# Patient Record
Sex: Female | Born: 1965
Health system: Southern US, Community
[De-identification: ages and names within clinical notes are randomized; demographics above are authoritative.]

## PROBLEM LIST (undated history)

## (undated) DIAGNOSIS — K222 Esophageal obstruction: Secondary | ICD-10-CM

## (undated) DIAGNOSIS — K8020 Calculus of gallbladder without cholecystitis without obstruction: Secondary | ICD-10-CM

## (undated) DIAGNOSIS — J302 Other seasonal allergic rhinitis: Secondary | ICD-10-CM

## (undated) DIAGNOSIS — E669 Obesity, unspecified: Secondary | ICD-10-CM

## (undated) DIAGNOSIS — L719 Rosacea, unspecified: Secondary | ICD-10-CM

## (undated) DIAGNOSIS — F329 Major depressive disorder, single episode, unspecified: Secondary | ICD-10-CM

## (undated) DIAGNOSIS — C44310 Basal cell carcinoma of skin of unspecified parts of face: Secondary | ICD-10-CM

## (undated) DIAGNOSIS — F3289 Other specified depressive episodes: Secondary | ICD-10-CM

## (undated) DIAGNOSIS — K219 Gastro-esophageal reflux disease without esophagitis: Secondary | ICD-10-CM

## (undated) DIAGNOSIS — E119 Type 2 diabetes mellitus without complications: Secondary | ICD-10-CM

## (undated) DIAGNOSIS — D259 Leiomyoma of uterus, unspecified: Secondary | ICD-10-CM

## (undated) DIAGNOSIS — E785 Hyperlipidemia, unspecified: Secondary | ICD-10-CM

## (undated) DIAGNOSIS — T7840XA Allergy, unspecified, initial encounter: Secondary | ICD-10-CM

## (undated) HISTORY — PX: UPPER GASTROINTESTINAL ENDOSCOPY: SHX188

## (undated) HISTORY — PX: COLON RESECTION: SHX5231

## (undated) HISTORY — DX: Basal cell carcinoma of skin of unspecified parts of face: C44.310

## (undated) HISTORY — DX: Rosacea, unspecified: L71.9

## (undated) HISTORY — PX: REDUCTION MAMMAPLASTY: SUR839

## (undated) HISTORY — DX: Other specified depressive episodes: F32.89

## (undated) HISTORY — PX: POLYPECTOMY: SHX149

## (undated) HISTORY — PX: WISDOM TOOTH EXTRACTION: SHX21

## (undated) HISTORY — DX: Major depressive disorder, single episode, unspecified: F32.9

## (undated) HISTORY — DX: Hyperlipidemia, unspecified: E78.5

## (undated) HISTORY — DX: Obesity, unspecified: E66.9

## (undated) HISTORY — DX: Leiomyoma of uterus, unspecified: D25.9

## (undated) HISTORY — DX: Other seasonal allergic rhinitis: J30.2

## (undated) HISTORY — DX: Allergy, unspecified, initial encounter: T78.40XA

## (undated) HISTORY — DX: Gastro-esophageal reflux disease without esophagitis: K21.9

## (undated) HISTORY — PX: COLONOSCOPY: SHX174

## (undated) HISTORY — PX: EXTERNAL EAR SURGERY: SHX627

## (undated) HISTORY — DX: Calculus of gallbladder without cholecystitis without obstruction: K80.20

## (undated) HISTORY — DX: Type 2 diabetes mellitus without complications: E11.9

## (undated) HISTORY — DX: Esophageal obstruction: K22.2

---
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
## 1999-10-07 ENCOUNTER — Other Ambulatory Visit: Admission: RE | Admit: 1999-10-07 | Discharge: 1999-10-07 | Payer: Self-pay | Admitting: Obstetrics and Gynecology

## 2000-05-11 ENCOUNTER — Encounter: Payer: Self-pay | Admitting: Internal Medicine

## 2000-05-11 ENCOUNTER — Ambulatory Visit (HOSPITAL_COMMUNITY): Admission: RE | Admit: 2000-05-11 | Discharge: 2000-05-11 | Payer: Self-pay | Admitting: Internal Medicine

## 2000-06-22 ENCOUNTER — Ambulatory Visit (HOSPITAL_COMMUNITY): Admission: RE | Admit: 2000-06-22 | Discharge: 2000-06-22 | Payer: Self-pay | Admitting: Internal Medicine

## 2000-06-22 ENCOUNTER — Encounter: Payer: Self-pay | Admitting: Internal Medicine

## 2000-10-11 ENCOUNTER — Other Ambulatory Visit: Admission: RE | Admit: 2000-10-11 | Discharge: 2000-10-11 | Payer: Self-pay | Admitting: Obstetrics and Gynecology

## 2001-09-14 ENCOUNTER — Other Ambulatory Visit: Admission: RE | Admit: 2001-09-14 | Discharge: 2001-09-14 | Payer: Self-pay | Admitting: Obstetrics and Gynecology

## 2002-11-15 ENCOUNTER — Other Ambulatory Visit: Admission: RE | Admit: 2002-11-15 | Discharge: 2002-11-15 | Payer: Self-pay | Admitting: Obstetrics and Gynecology

## 2003-08-22 ENCOUNTER — Encounter: Admission: RE | Admit: 2003-08-22 | Discharge: 2003-11-20 | Payer: Self-pay | Admitting: Family Medicine

## 2005-07-21 ENCOUNTER — Ambulatory Visit: Payer: Self-pay | Admitting: Internal Medicine

## 2005-08-14 DIAGNOSIS — K222 Esophageal obstruction: Secondary | ICD-10-CM

## 2005-08-14 HISTORY — DX: Esophageal obstruction: K22.2

## 2005-08-19 ENCOUNTER — Ambulatory Visit: Payer: Self-pay | Admitting: Internal Medicine

## 2005-08-19 ENCOUNTER — Encounter: Payer: Self-pay | Admitting: Internal Medicine

## 2005-11-10 ENCOUNTER — Ambulatory Visit: Payer: Self-pay | Admitting: Internal Medicine

## 2007-02-24 ENCOUNTER — Ambulatory Visit: Payer: Self-pay | Admitting: Internal Medicine

## 2007-03-14 DIAGNOSIS — K222 Esophageal obstruction: Secondary | ICD-10-CM | POA: Insufficient documentation

## 2007-03-14 DIAGNOSIS — K219 Gastro-esophageal reflux disease without esophagitis: Secondary | ICD-10-CM

## 2007-03-14 DIAGNOSIS — R1310 Dysphagia, unspecified: Secondary | ICD-10-CM | POA: Insufficient documentation

## 2007-03-23 ENCOUNTER — Ambulatory Visit (HOSPITAL_COMMUNITY): Admission: RE | Admit: 2007-03-23 | Discharge: 2007-03-23 | Payer: Self-pay | Admitting: Internal Medicine

## 2008-04-10 ENCOUNTER — Telehealth: Payer: Self-pay | Admitting: Internal Medicine

## 2008-05-04 ENCOUNTER — Ambulatory Visit: Payer: Self-pay | Admitting: Internal Medicine

## 2008-05-04 DIAGNOSIS — K8020 Calculus of gallbladder without cholecystitis without obstruction: Secondary | ICD-10-CM | POA: Insufficient documentation

## 2008-08-21 ENCOUNTER — Ambulatory Visit: Payer: Self-pay | Admitting: Internal Medicine

## 2008-08-21 DIAGNOSIS — F32A Depression, unspecified: Secondary | ICD-10-CM | POA: Insufficient documentation

## 2008-08-21 DIAGNOSIS — L719 Rosacea, unspecified: Secondary | ICD-10-CM | POA: Insufficient documentation

## 2008-08-21 DIAGNOSIS — K279 Peptic ulcer, site unspecified, unspecified as acute or chronic, without hemorrhage or perforation: Secondary | ICD-10-CM | POA: Insufficient documentation

## 2008-08-21 DIAGNOSIS — F329 Major depressive disorder, single episode, unspecified: Secondary | ICD-10-CM

## 2008-08-21 DIAGNOSIS — E669 Obesity, unspecified: Secondary | ICD-10-CM | POA: Insufficient documentation

## 2008-08-21 LAB — CONVERTED CEMR LAB: Pap Smear: NORMAL

## 2008-09-26 ENCOUNTER — Encounter (INDEPENDENT_AMBULATORY_CARE_PROVIDER_SITE_OTHER): Payer: Self-pay | Admitting: *Deleted

## 2008-09-26 ENCOUNTER — Ambulatory Visit: Payer: Self-pay | Admitting: Internal Medicine

## 2008-09-26 LAB — CONVERTED CEMR LAB
ALT: 49 units/L — ABNORMAL HIGH (ref 0–35)
AST: 30 units/L (ref 0–37)
Albumin: 3.5 g/dL (ref 3.5–5.2)
Alkaline Phosphatase: 63 units/L (ref 39–117)
BUN: 11 mg/dL (ref 6–23)
Basophils Absolute: 0 10*3/uL (ref 0.0–0.1)
Basophils Relative: 0.6 % (ref 0.0–3.0)
Bilirubin Urine: NEGATIVE
Bilirubin, Direct: 0.1 mg/dL (ref 0.0–0.3)
CO2: 27 meq/L (ref 19–32)
Calcium: 8.9 mg/dL (ref 8.4–10.5)
Chloride: 108 meq/L (ref 96–112)
Cholesterol: 197 mg/dL (ref 0–200)
Creatinine, Ser: 0.8 mg/dL (ref 0.4–1.2)
Eosinophils Absolute: 0.2 10*3/uL (ref 0.0–0.7)
Eosinophils Relative: 3.4 % (ref 0.0–5.0)
GFR calc non Af Amer: 82.88 mL/min (ref >60)
Glucose, Bld: 92 mg/dL (ref 70–99)
HCT: 40.5 % (ref 36.0–46.0)
HDL: 37.5 mg/dL — ABNORMAL LOW (ref >39.00)
Hemoglobin, Urine: NEGATIVE
Hemoglobin: 14 g/dL (ref 12.0–15.0)
Ketones, ur: NEGATIVE mg/dL
LDL Cholesterol: 147 mg/dL — ABNORMAL HIGH (ref 0–99)
Leukocytes, UA: NEGATIVE
Lymphocytes Relative: 38.9 % (ref 12.0–46.0)
Lymphs Abs: 2.8 10*3/uL (ref 0.7–4.0)
MCHC: 34.5 g/dL (ref 30.0–36.0)
MCV: 91.2 fL (ref 78.0–100.0)
Monocytes Absolute: 0.4 10*3/uL (ref 0.1–1.0)
Monocytes Relative: 5.9 % (ref 3.0–12.0)
Neutro Abs: 3.8 10*3/uL (ref 1.4–7.7)
Neutrophils Relative %: 51.2 % (ref 43.0–77.0)
Nitrite: NEGATIVE
Platelets: 287 10*3/uL (ref 150.0–400.0)
Potassium: 4.3 meq/L (ref 3.5–5.1)
RBC: 4.44 M/uL (ref 3.87–5.11)
RDW: 12.3 % (ref 11.5–14.6)
Sodium: 140 meq/L (ref 135–145)
Specific Gravity, Urine: >=1.03 (ref 1.000–1.030)
TSH: 1.84 microintl units/mL (ref 0.35–5.50)
Total Bilirubin: 0.7 mg/dL (ref 0.3–1.2)
Total CHOL/HDL Ratio: 5
Total Protein, Urine: NEGATIVE mg/dL
Total Protein: 6.6 g/dL (ref 6.0–8.3)
Triglycerides: 63 mg/dL (ref 0.0–149.0)
Urine Glucose: NEGATIVE mg/dL
Urobilinogen, UA: 0.2 (ref 0.0–1.0)
VLDL: 12.6 mg/dL (ref 0.0–40.0)
Vit D, 25-Hydroxy: 41 ng/mL (ref 30–89)
WBC: 7.2 10*3/uL (ref 4.5–10.5)
pH: 5.5 (ref 5.0–8.0)

## 2008-09-27 ENCOUNTER — Encounter: Payer: Self-pay | Admitting: Internal Medicine

## 2008-09-27 ENCOUNTER — Encounter: Admission: RE | Admit: 2008-09-27 | Discharge: 2008-09-27 | Payer: Self-pay | Admitting: Internal Medicine

## 2008-10-03 ENCOUNTER — Ambulatory Visit: Payer: Self-pay | Admitting: Internal Medicine

## 2008-10-04 ENCOUNTER — Encounter (INDEPENDENT_AMBULATORY_CARE_PROVIDER_SITE_OTHER): Payer: Self-pay | Admitting: *Deleted

## 2008-11-05 ENCOUNTER — Encounter: Payer: Self-pay | Admitting: Internal Medicine

## 2009-02-05 ENCOUNTER — Telehealth: Payer: Self-pay | Admitting: Internal Medicine

## 2009-05-20 ENCOUNTER — Ambulatory Visit: Payer: Self-pay | Admitting: Internal Medicine

## 2009-05-20 DIAGNOSIS — J329 Chronic sinusitis, unspecified: Secondary | ICD-10-CM | POA: Insufficient documentation

## 2009-12-31 ENCOUNTER — Ambulatory Visit: Payer: Self-pay | Admitting: Internal Medicine

## 2010-03-18 ENCOUNTER — Ambulatory Visit
Admission: RE | Admit: 2010-03-18 | Discharge: 2010-03-18 | Payer: Self-pay | Source: Home / Self Care | Attending: Internal Medicine | Admitting: Internal Medicine

## 2010-04-06 ENCOUNTER — Encounter: Payer: Self-pay | Admitting: Oncology

## 2010-04-15 NOTE — Assessment & Plan Note (Signed)
Summary: SORE THROAT  HEAD CONGESTION  STC   Vital Signs:  Patient profile:   45 year old female Height:      66 inches (167.64 cm) Weight:      196.0 pounds (89.09 kg) O2 Sat:      97 % on Room air Temp:     97.9 degrees F (36.61 degrees C) oral Pulse rate:   85 / minute BP sitting:   110 / 72  (left arm) Cuff size:   large  Vitals Entered By: Orlan Leavens RMA (December 31, 2009 2:09 PM)  O2 Flow:  Room air CC: head congestion/ sore throat Is Patient Diabetic? No Pain Assessment Patient in pain? no      Comments Req refill on nexium   Primary Care Provider:  Newt Lukes MD  CC:  head congestion/ sore throat.  History of Present Illness:  URI Symptoms      This is a 45 year old woman who presents with URI symptoms.  The symptoms began 6 days ago.  The severity is described as moderate.  The patient reports nasal congestion, clear but thick nasal discharge, sore throat, and sick contacts, and left earache.  Associated symptoms include low-grade fever (<100.5 degrees) and response to antipyretic.  The patient denies stiff neck, dyspnea, wheezing, vomiting, and diarrhea.  The patient also reports sneezing, headache, and severe fatigue.  The patient denies itchy watery eyes, itchy throat, seasonal symptoms, and muscle aches.  The patient denies the following risk factors for Strep sinusitis: tooth pain, Strep exposure, and tender adenopathy.    Clinical Review Panels:  Immunizations   Last Tetanus Booster:  Tdap (08/21/2008)  Lipid Management   Cholesterol:  197 (09/26/2008)   LDL (bad choesterol):  147 (09/26/2008)   HDL (good cholesterol):  37.50 (09/26/2008)  CBC   WBC:  7.2 (09/26/2008)   RBC:  4.44 (09/26/2008)   Hgb:  14.0 (09/26/2008)   Hct:  40.5 (09/26/2008)   Platelets:  287.0 (09/26/2008)   MCV  91.2 (09/26/2008)   MCHC  34.5 (09/26/2008)   RDW  12.3 (09/26/2008)   PMN:  51.2 (09/26/2008)   Lymphs:  38.9 (09/26/2008)   Monos:  5.9 (09/26/2008)  Eosinophils:  3.4 (09/26/2008)   Basophil:  0.6 (09/26/2008)  Complete Metabolic Panel   Glucose:  92 (09/26/2008)   Sodium:  140 (09/26/2008)   Potassium:  4.3 (09/26/2008)   Chloride:  108 (09/26/2008)   CO2:  27 (09/26/2008)   BUN:  11 (09/26/2008)   Creatinine:  0.8 (09/26/2008)   Albumin:  3.5 (09/26/2008)   Total Protein:  6.6 (09/26/2008)   Calcium:  8.9 (09/26/2008)   Total Bili:  0.7 (09/26/2008)   Alk Phos:  63 (09/26/2008)   SGPT (ALT):  49 (09/26/2008)   SGOT (AST):  30 (09/26/2008)   Current Medications (verified): 1)  Nexium 40 Mg Cpdr (Esomeprazole Magnesium) .... Take 1 Tablet By Mouth Once A Day. 2)  Retin-A 0.1 % Crea (Tretinoin) .... Apply At Bedtime As Directed 3)  Vitamin D 400 Unit Tabs (Cholecalciferol) .... Take 2 By Mouth Qd 4)  Pre-Natal  Tabs (Prenatal Multivit-Min-Fe-Fa) .... Take 1 By Mouth Qd 5)  Zyrtec Hives Relief 10 Mg Tabs (Cetirizine Hcl) .... Take 1 By Mouth Qd 6)  Doxycycline Hyclate 100 Mg Caps (Doxycycline Hyclate) .... Take 2 By Mouth Once Daily 7)  Sudafed 30 Mg Tabs (Pseudoephedrine Hcl) .... Take As Needed  Allergies (verified): No Known Drug Allergies  Past History:  Past Medical History: GALLSTONES ESOPHAGEAL STRICTURE  roseca Depression GERD  MD roster: gyn - [facility], ?silva  Review of Systems  The patient denies vision loss, hoarseness, hemoptysis, and abdominal pain.    Physical Exam  General:  overweight-appearing.  alert, well-developed, well-nourished, and cooperative to examination.  mildly ill   Eyes:  vision grossly intact; pupils equal, round and reactive to light.  conjunctiva and lids normal.    Ears:  normal pinnae bilaterally, without erythema, swelling, or tenderness to palpation. TMs hazy but clear, without effusion, or cerumen impaction. Hearing grossly normal bilaterally  Mouth:  teeth and gums in good repair; mucous membranes moist, without lesions or ulcers. oropharynx clear without exudate, mild  erythema. +yellow PND Lungs:  normal respiratory effort, no intercostal retractions or use of accessory muscles; normal breath sounds bilaterally - no crackles and no wheezes.    Heart:  normal rate, regular rhythm, no murmur, and no rub. BLE without edema.    Impression & Recommendations:  Problem # 1:  UNSPECIFIED SINUSITIS (ICD-473.9)  The following medications were removed from the medication list:    Mucinex Dm 30-600 Mg Xr12h-tab (Dextromethorphan-guaifenesin) .Marland Kitchen... Taker as needed Her updated medication list for this problem includes:    Doxycycline Hyclate 100 Mg Caps (Doxycycline hyclate) .Marland Kitchen... Take 2 by mouth once daily    Sudafed 30 Mg Tabs (Pseudoephedrine hcl) .Marland Kitchen... Take as needed    Azithromycin 250 Mg Tabs (Azithromycin) .Marland Kitchen... 2 tabs by mouth today, then 1 by mouth daily starting tomorrow  Take antibiotics for full duration. Discussed treatment options including indications for coronal CT scan of sinuses and ENT referral.   Orders: Prescription Created Electronically [phone])  Complete Medication List: 1)  Nexium 40 Mg Cpdr (Esomeprazole magnesium) .Marland Kitchen.. 1 by mouth once daily 2)  Retin-a 0.1 % Crea (Tretinoin) .... Apply at bedtime as directed 3)  Vitamin D 400 Unit Tabs (Cholecalciferol) .... Take 2 by mouth qd 4)  Pre-natal Tabs (Prenatal multivit-min-fe-fa) .... Take 1 by mouth qd 5)  Zyrtec Hives Relief 10 Mg Tabs (Cetirizine hcl) .... Take 1 by mouth qd 6)  Doxycycline Hyclate 100 Mg Caps (Doxycycline hyclate) .... Take 2 by mouth once daily 7)  Sudafed 30 Mg Tabs (Pseudoephedrine hcl) .... Take as needed 8)  Azithromycin 250 Mg Tabs (Azithromycin) .... 2 tabs by mouth today, then 1 by mouth daily starting tomorrow  Patient Instructions: 1)  it was good to see you today. 2)  Zpack antibiotics for sinus symptoms as discussed - your prescription has been electronically submitted to your pharmacy. Please take as directed. Contact our office if you believe you're having  problems with the medication(s).  3)  Get plenty of rest, drink lots of clear liquids, and use Tylenol or Ibuprofen for fever and comfort. Return in 7-10 days if you're not better:sooner if you're feeling worse. 4)  ask [facility] about seeing either dr. Truett Mainland or dr. Henderson Cloud instead of dr. Tenny Craw - if unable to see different provider, let us know and we can refer to different group for evaluation as needed  Prescriptions: NEXIUM 40 MG CPDR (ESOMEPRAZOLE MAGNESIUM) 1 by mouth once daily  #30 x 11   Entered and Authorized by:   Newt Lukes MD   Signed by:   Newt Lukes MD on 12/31/2009   Method used:   Electronically to        [facility] [address] (retail)       [phone]  [place], [place]  [address]       Ph: [phone]       Fax: [phone]   RxID:   [id number] AZITHROMYCIN 250 MG TABS (AZITHROMYCIN) 2 tabs by mouth today, then 1 by mouth daily starting tomorrow  #6 x 0   Entered and Authorized by:   Newt Lukes MD   Signed by:   Newt Lukes MD on 12/31/2009   Method used:   Electronically to        [facility] (retail)       [address]       [place], [place]  [address]       Ph: [phone]       Fax: [phone]   RxID:   [id number]    Orders Added: 1)  Est. Patient Level IV [85277] 2)  Prescription Created Electronically [phone]

## 2010-04-15 NOTE — Assessment & Plan Note (Signed)
Summary: BAD COLD /NWS  #   Vital Signs:  Patient profile:   45 year old female Height:      66 inches (167.64 cm) Weight:      197.6 pounds (89.82 kg) BMI:     32.01 O2 Sat:      99 % on Room air Temp:     97.6 degrees F (36.44 degrees C) oral Pulse rate:   74 / minute BP sitting:   118 / 92  (left arm) Cuff size:   regular  Vitals Entered By: Orlan Leavens (May 20, 2009 1:44 PM)  O2 Flow:  Room air CC: Cold sxs x's 4 days, URI symptoms Is Patient Diabetic? No Pain Assessment Patient in pain? no        Primary Care Provider:  Newt Lukes MD  CC:  Cold sxs x's 4 days and URI symptoms.  History of Present Illness:  URI Symptoms      This is a 45 year old woman who presents with URI symptoms.  The symptoms began 4 days ago.  The severity is described as moderate.  The patient reports nasal congestion, purulent nasal discharge, sore throat, and sick contacts, but denies earache.  Associated symptoms include low-grade fever (<100.5 degrees) and response to antipyretic.  The patient denies stiff neck, dyspnea, wheezing, vomiting, and diarrhea.  The patient also reports sneezing, headache, and severe fatigue.  The patient denies itchy watery eyes, itchy throat, seasonal symptoms, and muscle aches.  The patient denies the following risk factors for Strep sinusitis: tooth pain, Strep exposure, and tender adenopathy.    Current Medications (verified): 1)  Nexium 40 Mg Cpdr (Esomeprazole Magnesium) .... Take 1 Tablet By Mouth Once A Day. 2)  Oracea 40 Mg Cpdr (Doxycycline (Rosacea)) .... One Capsule By Mouth Once Daily 3)  Retin-A 0.1 % Crea (Tretinoin) .... Apply At Bedtime As Directed 4)  Vitamin D 400 Unit Tabs (Cholecalciferol) .... Take 2 By Mouth Qd 5)  Pre-Natal  Tabs (Prenatal Multivit-Min-Fe-Fa) .... Take 1 By Mouth Qd 6)  Zyrtec Hives Relief 10 Mg Tabs (Cetirizine Hcl) .... Take 1 By Mouth Qd 7)  Mucinex Dm 30-600 Mg Xr12h-Tab (Dextromethorphan-Guaifenesin) .... Taker  As Needed  Allergies (verified): No Known Drug Allergies  Past History:  Past Medical History: Reviewed history from 08/21/2008 and no changes required. GALLSTONES ESOPHAGEAL STRICTURE  roseca Depression GERD  Review of Systems  The patient denies anorexia, vision loss, decreased hearing, hoarseness, chest pain, peripheral edema, hemoptysis, and abdominal pain.    Physical Exam  General:  overweight-appearing.  alert, well-developed, well-nourished, and cooperative to examination.  mildly ill   Eyes:  vision grossly intact; pupils equal, round and reactive to light.  conjunctiva and lids normal.    Ears:  normal pinnae bilaterally, without erythema, swelling, or tenderness to palpation. TMs hazy but clear, without effusion, or cerumen impaction. Hearing grossly normal bilaterally  Nose:  mild L and R maxillary sinus tenderness.   Mouth:  teeth and gums in good repair; mucous membranes moist, without lesions or ulcers. oropharynx clear without exudate, mild erythema. +yellow PND Lungs:  normal respiratory effort, no intercostal retractions or use of accessory muscles; normal breath sounds bilaterally - no crackles and no wheezes.    Heart:  normal rate, regular rhythm, no murmur, and no rub. BLE without edema.    Impression & Recommendations:  Problem # 1:  UNSPECIFIED SINUSITIS (ICD-473.9)  Her updated medication list for this problem includes:  Mucinex Dm 30-600 Mg Xr12h-tab (Dextromethorphan-guaifenesin) .Marland Kitchen... Taker as needed    Azithromycin 250 Mg Tabs (Azithromycin) .Marland Kitchen... 2 tabs by mouth today, then 1 by mouth daily starting tomorrow  Take antibiotics for full duration. Discussed treatment options including prescription and OTC meds  Complete Medication List: 1)  Nexium 40 Mg Cpdr (Esomeprazole magnesium) .... Take 1 tablet by mouth once a day. 2)  Oracea 40 Mg Cpdr (Doxycycline (rosacea)) .... One capsule by mouth once daily 3)  Retin-a 0.1 % Crea (Tretinoin) ....  Apply at bedtime as directed 4)  Vitamin D 400 Unit Tabs (Cholecalciferol) .... Take 2 by mouth qd 5)  Pre-natal Tabs (Prenatal multivit-min-fe-fa) .... Take 1 by mouth qd 6)  Zyrtec Hives Relief 10 Mg Tabs (Cetirizine hcl) .... Take 1 by mouth qd 7)  Mucinex Dm 30-600 Mg Xr12h-tab (Dextromethorphan-guaifenesin) .... Taker as needed 8)  Azithromycin 250 Mg Tabs (Azithromycin) .... 2 tabs by mouth today, then 1 by mouth daily starting tomorrow  Patient Instructions: 1)  antibiotics for sinus symptoms as discussed - 2)  Get plenty of rest, drink lots of clear liquids, and use Tylenol or Ibuprofen for fever and comfort. Return in 7-10 days if you're not better:sooner if you're feeling worse. Prescriptions: AZITHROMYCIN 250 MG TABS (AZITHROMYCIN) 2 tabs by mouth today, then 1 by mouth daily starting tomorrow  #6 x 0   Entered and Authorized by:   Newt Lukes MD   Signed by:   Newt Lukes MD on 05/20/2009   Method used:   Electronically to        [facility] (retail)       [address]       [place], [place]  [address]       Ph: [phone]       Fax: [phone]   RxID:   [id number]

## 2010-04-17 NOTE — Assessment & Plan Note (Signed)
Summary: SINUS INFECTION/NWS   Vital Signs:  Patient profile:   45 year old female Height:      66 inches (167.64 cm) Weight:      196 pounds (89.09 kg) BMI:     31.75 O2 Sat:      97 % on Room air Temp:     98.4 degrees F (36.89 degrees C) oral Pulse rate:   72 / minute BP sitting:   110 / 80  (left arm) Cuff size:   large  Vitals Entered By: Orlan Leavens RMA (March 18, 2010 3:49 PM)  O2 Flow:  Room air CC: Sinus infection Is Patient Diabetic? No Pain Assessment Patient in pain? no        Primary Care Provider:  Newt Lukes MD  CC:  Sinus infection.  History of Present Illness:  URI Symptoms      This is a 45 year old woman who presents with URI symptoms.  The symptoms began 3-4 days ago.  The severity is described as moderate.  The patient reports nasal congestion, clear but thick nasal discharge, sore throat, and sick contacts, and left earache.  Associated symptoms include low-grade fever (<100.5 degrees) and response to antipyretic.  The patient denies stiff neck, dyspnea, wheezing, vomiting, and diarrhea.  The patient also reports sneezing, headache, and severe fatigue.  The patient denies itchy watery eyes, itchy throat, seasonal symptoms, and muscle aches.  The patient denies the following risk factors for Strep sinusitis: tooth pain, Strep exposure, and tender adenopathy.    Clinical Review Panels:  CBC   WBC:  7.2 (09/26/2008)   RBC:  4.44 (09/26/2008)   Hgb:  14.0 (09/26/2008)   Hct:  40.5 (09/26/2008)   Platelets:  287.0 (09/26/2008)   MCV  91.2 (09/26/2008)   MCHC  34.5 (09/26/2008)   RDW  12.3 (09/26/2008)   PMN:  51.2 (09/26/2008)   Lymphs:  38.9 (09/26/2008)   Monos:  5.9 (09/26/2008)   Eosinophils:  3.4 (09/26/2008)   Basophil:  0.6 (09/26/2008)  Complete Metabolic Panel   Glucose:  92 (09/26/2008)   Sodium:  140 (09/26/2008)   Potassium:  4.3 (09/26/2008)   Chloride:  108 (09/26/2008)   CO2:  27 (09/26/2008)   BUN:  11 (09/26/2008)   Creatinine:  0.8 (09/26/2008)   Albumin:  3.5 (09/26/2008)   Total Protein:  6.6 (09/26/2008)   Calcium:  8.9 (09/26/2008)   Total Bili:  0.7 (09/26/2008)   Alk Phos:  63 (09/26/2008)   SGPT (ALT):  49 (09/26/2008)   SGOT (AST):  30 (09/26/2008)   Current Medications (verified): 1)  Nexium 40 Mg Cpdr (Esomeprazole Magnesium) .Marland Kitchen.. 1 By Mouth Once Daily 2)  Retin-A 0.1 % Crea (Tretinoin) .... Apply At Bedtime As Directed 3)  Vitamin D 400 Unit Tabs (Cholecalciferol) .... Take 2 By Mouth Qd 4)  Pre-Natal  Tabs (Prenatal Multivit-Min-Fe-Fa) .... Take 1 By Mouth Qd 5)  Zyrtec Hives Relief 10 Mg Tabs (Cetirizine Hcl) .... Take 1 By Mouth Qd 6)  Doxycycline Hyclate 100 Mg Caps (Doxycycline Hyclate) .... Take 2 By Mouth Once Daily 7)  Sudafed 30 Mg Tabs (Pseudoephedrine Hcl) .... Take As Needed  Allergies (verified): No Known Drug Allergies  Past History:  Past Medical History: GALLSTONES ESOPHAGEAL STRICTURE  roseca Depression GERD   MD roster: gyn - [facility], ?silva  Review of Systems  The patient denies weight loss, chest pain, syncope, and headaches.    Physical Exam  General:  overweight-appearing.  alert, well-developed, well-nourished, and cooperative to examination.  mildly ill   Eyes:  vision grossly intact; pupils equal, round and reactive to light.  conjunctiva and lids normal.    Ears:  normal pinnae bilaterally, without erythema, swelling, or tenderness to palpation. TMs hazy but clear, without effusion, or cerumen impaction. Hearing grossly normal bilaterally  Mouth:  teeth and gums in good repair; mucous membranes moist, without lesions or ulcers. oropharynx clear without exudate, mild erythema. +yellow PND Lungs:  normal respiratory effort, no intercostal retractions or use of accessory muscles; normal breath sounds bilaterally - no crackles and no wheezes.    Heart:  normal rate, regular rhythm, no murmur, and no rub. BLE without edema.    Impression &  Recommendations:  Problem # 1:  UNSPECIFIED SINUSITIS (ICD-473.9)  Her updated medication list for this problem includes:    Doxycycline Hyclate 100 Mg Caps (Doxycycline hyclate) .Marland Kitchen... Take 2 by mouth once daily    Sudafed 30 Mg Tabs (Pseudoephedrine hcl) .Marland Kitchen... Take as needed    Azithromycin 250 Mg Tabs (Azithromycin) .Marland Kitchen... 2 tabs by mouth today, then 1 by mouth daily starting tomorrow  Take antibiotics for full duration. Discussed treatment options including indications for coronal CT scan of sinuses and ENT referral.   Orders: Prescription Created Electronically [phone])  Complete Medication List: 1)  Nexium 40 Mg Cpdr (Esomeprazole magnesium) .Marland Kitchen.. 1 by mouth once daily 2)  Retin-a 0.1 % Crea (Tretinoin) .... Apply at bedtime as directed 3)  Vitamin D 400 Unit Tabs (Cholecalciferol) .... Take 2 by mouth qd 4)  Pre-natal Tabs (Prenatal multivit-min-fe-fa) .... Take 1 by mouth qd 5)  Zyrtec Hives Relief 10 Mg Tabs (Cetirizine hcl) .... Take 1 by mouth qd 6)  Doxycycline Hyclate 100 Mg Caps (Doxycycline hyclate) .... Take 2 by mouth once daily 7)  Sudafed 30 Mg Tabs (Pseudoephedrine hcl) .... Take as needed 8)  Azithromycin 250 Mg Tabs (Azithromycin) .... 2 tabs by mouth today, then 1 by mouth daily starting tomorrow  Patient Instructions: 1)  it was good to see you today. 2)  Zpack antibiotics for sinus symptoms as discussed - your prescription has been electronically submitted to your pharmacy. Please take as directed. Contact our office if you believe you're having problems with the medication(s).  3)  Get plenty of rest, drink lots of clear liquids, and use Tylenol or Ibuprofen for fever and comfort. Return in 7-10 days if you're not better:sooner if you're feeling worse.  Prescriptions: AZITHROMYCIN 250 MG TABS (AZITHROMYCIN) 2 tabs by mouth today, then 1 by mouth daily starting tomorrow  #6 x 0   Entered and Authorized by:   Newt Lukes MD   Signed by:   Newt Lukes  MD on 03/18/2010   Method used:   Electronically to        [facility] [phone]* (retail)       [address]       [place], [place]  [address]       Ph: [phone]       Fax: [phone]   RxID:   [id number]    Orders Added: 1)  Est. Patient Level IV [27253] 2)  Prescription Created Electronically [phone]

## 2010-06-12 ENCOUNTER — Other Ambulatory Visit: Payer: Self-pay | Admitting: Obstetrics and Gynecology
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
## 2010-08-01 NOTE — Letter (Signed)
November 10, 2005     [place]. Daphine Deutscher, MD  [address], [address]  [place], [place] [address]   RE:  [name], [name]  MRN:  [id number]  /  DOB:  Nov 22, 1965   Dear Susy Frizzle:   I would appreciate your assistance with Ms. Courtney Tran.  She has an  appointment with you for consideration of Nissan fundoplication.  I met her  in February 2002 for evaluation of solid food dysphagia.  Courtney Tran was 45  years old at the time and had a marked esophageal stricture, which she  required two dilatations six weeks apart.  We used several sizes, from 11 mm  to 15 mm.  The second dilatation was up to 17 mm.  She has since then been  on proton pump inhibitor, Nexium 40 mg daily, and she had required another  dilatation in June 2007 after she woke up with severe episode of reflux.  She developed severe substernal chest pain after she attended a wedding.  She took her Nexium on an everyday basis.  Since the dilatation there has  been on clinical problems with her reflux, but she desires to have more  definite procedure done to avoid episodes like the one in June 2007.   I have set [place] for esophageal manometry at [facility] which will be done  by the time she sees you.  She is following strict antireflux measures and  continues her Nexium 40 mg daily.  I did not feel she needed 24-hour pH  probe because she has a documented esophageal stricture without evidence of  Barrett's esophagus.   I appreciate your assistance with this nice lady.  You will find her very  pleasant and cooperative.    Sincerely,      Hedwig Morton. Juanda Chance, MD   DMB/MedQ  DD:  11/10/2005  DT:  11/11/2005  Job #:  [id number]   CC:    Quita Skye. Artis Flock, MD

## 2010-08-01 NOTE — Procedures (Signed)
[facility]  Patient:    Courtney Tran, Courtney Tran                            MRN: [id number] Proc. Date: 05/11/00 Adm. Date:  [id number] Attending:  Mervin Hack CC:         Silverio Lay, M.D.   Procedure Report  PROCEDURE:  Upper endoscopy with esophageal dilation.  INDICATIONS:  This 45 year old white female presented with a progressive solid food dysphagia.  She has a history of peptic ulcer disease.  Barium swallow approximately 1-1/2 years ago apparently showed hiatal hernia.  She denies any dysphagia for liquids.  She has recently been choking even on rice and small particles that she chews.  Upper endoscopy in 1991 in [place], [place], apparently resulted in a dilatation of the esophagus.  ENDOSCOPE:  Olympus single-channel video endoscope.  SEDATION:  Versed 20 mg IV, Demerol 60 mg IV.  FINDINGS:  Olympus single-channel video endoscope passed under direct vision through the posterior pharynx into the esophagus.  The patient was monitored by pulse oximeter.  Oxygen saturations were normal.  She was very cooperative throughout the procedure.  Fluoroscopic guidance was used.  Proximal and mid esophageal mucosa were unremarkable.  There was rather severe distal esophageal stricture of the GE junction and 35 cm from the incisors.  The 11 mm endoscope initially could not pass through the tight stricture which was concentric and fibrous.  There was no evidence of malignancy.  Only with some pressure and resistance, the endoscope finally popped into the stomach.  There was some bleeding from the orifice of the stricture.  STOMACH:  Stomach was insufflated with air and showed essentially normal appearing gastric mucosa, normal pyloric outlet and retroflexion of endoscope showed normal fundus and cardia.  DUODENUM:  Duodenum, duodenal bulb, and descending duodenum is normal.  Guide wire was then placed into the stomach under fluoroscopic  guidance and the scope was retracted and savary dilators passed over the guide wire using 11, 12, 12.8, 14, and 15 mm dilators.  There was a small amount of blood on each dilator.  Patient tolerated procedure well.  IMPRESSION:  Moderately severe distal esophageal stricture, status post dilatation to 15 mm.  PLAN:  Only partial dilatation has been carried out because of the severity of the stricture which appears totally benign, most likely related to gastroesophageal reflux.  Patient will continue on Prevacid 30 mg on a daily basis and will return for a repeat dilatation in six weeks.  She also is to continue on antireflux measures. DD:  05/11/00 TD:  05/11/00 Job: [id number]

## 2010-10-28 ENCOUNTER — Encounter: Payer: Self-pay | Admitting: Internal Medicine

## 2010-12-16 ENCOUNTER — Ambulatory Visit (HOSPITAL_COMMUNITY)
Admission: RE | Admit: 2010-12-16 | Payer: BC Managed Care – PPO | Source: Ambulatory Visit | Admitting: Obstetrics and Gynecology

## 2010-12-16 ENCOUNTER — Encounter (HOSPITAL_COMMUNITY): Admission: RE | Payer: Self-pay | Source: Ambulatory Visit

## 2010-12-16 SURGERY — HYSTERECTOMY, VAGINAL, LAPAROSCOPY-ASSISTED
Anesthesia: General

## 2010-12-22 ENCOUNTER — Other Ambulatory Visit: Payer: Self-pay | Admitting: Dermatology

## 2011-01-21 ENCOUNTER — Telehealth: Payer: Self-pay | Admitting: Internal Medicine

## 2011-01-21 MED ORDER — ESOMEPRAZOLE MAGNESIUM 40 MG PO CPDR: 40.0000 mg | DELAYED_RELEASE_CAPSULE | Freq: Every day | ORAL | Status: DC

## 2011-01-21 NOTE — Telephone Encounter (Signed)
Called pt no answer left msg on cell sent [facility] to [facility]...01/21/11@1 :35pm/LMB

## 2011-01-21 NOTE — Telephone Encounter (Signed)
The pt called and is requesting a refill of [facility] to the [facility] on [address].    Thanks!

## 2011-03-17 DIAGNOSIS — C44310 Basal cell carcinoma of skin of unspecified parts of face: Secondary | ICD-10-CM

## 2011-03-17 HISTORY — DX: Basal cell carcinoma of skin of unspecified parts of face: C44.310

## 2011-03-25 ENCOUNTER — Other Ambulatory Visit: Payer: Self-pay | Admitting: Dermatology

## 2011-05-07 ENCOUNTER — Emergency Department (HOSPITAL_COMMUNITY)
Admission: EM | Admit: 2011-05-07 | Discharge: 2011-05-07 | Disposition: A | Discharge status: Home / Self Care | Payer: BC Managed Care – PPO | Attending: Emergency Medicine | Admitting: Emergency Medicine

## 2011-05-07 ENCOUNTER — Ambulatory Visit: Payer: Self-pay | Admitting: Otolaryngology

## 2011-05-07 DIAGNOSIS — S0502XA Injury of conjunctiva and corneal abrasion without foreign body, left eye, initial encounter: Secondary | ICD-10-CM

## 2011-05-07 DIAGNOSIS — S058X9A Other injuries of unspecified eye and orbit, initial encounter: Secondary | ICD-10-CM | POA: Insufficient documentation

## 2011-05-07 DIAGNOSIS — F329 Major depressive disorder, single episode, unspecified: Secondary | ICD-10-CM | POA: Insufficient documentation

## 2011-05-07 DIAGNOSIS — Y849 Medical procedure, unspecified as the cause of abnormal reaction of the patient, or of later complication, without mention of misadventure at the time of the procedure: Secondary | ICD-10-CM | POA: Insufficient documentation

## 2011-05-07 DIAGNOSIS — H5710 Ocular pain, unspecified eye: Secondary | ICD-10-CM | POA: Insufficient documentation

## 2011-05-07 DIAGNOSIS — F3289 Other specified depressive episodes: Secondary | ICD-10-CM | POA: Insufficient documentation

## 2011-05-07 DIAGNOSIS — K219 Gastro-esophageal reflux disease without esophagitis: Secondary | ICD-10-CM | POA: Insufficient documentation

## 2011-05-07 MED ORDER — TOBRAMYCIN 0.3 % OP SOLN
2.0000 [drp] | OPHTHALMIC | Status: DC
  Administered 2011-05-07: 2 [drp] via OPHTHALMIC
  Filled 2011-05-07: qty 5

## 2011-05-07 MED ORDER — TOBRAMYCIN 0.3 % OP SOLN: 2.0000 [drp] | OPHTHALMIC | Status: DC

## 2011-05-07 NOTE — ED Provider Notes (Signed)
Medical screening examination/treatment/procedure(s) were performed by non-physician practitioner and as supervising physician I was immediately available for consultation/collaboration.   Loren Racer, MD 05/07/11 [phone]

## 2011-05-07 NOTE — Discharge Instructions (Signed)
Corneal Abrasion The cornea is the clear covering at the front and center of the eye. It is a thin tissue made up of layers. The top layer is the most sensitive layer. A corneal abrasion happens if this layer is scratched or an injury causes it to come off.  HOME CARE  You may be given drops or a medicated cream. Use the medicine as told by your doctor.   A pressure patch may be put over the eye. If this is done, follow your doctor's instructions for when to remove the patch. Do not drive or use machines while the eye patch is on. Judging distances is hard to do with a patch on.   See your doctor for a follow-up exam if you are told to do so.  GET HELP RIGHT AWAY IF:   The pain is getting worse or is very bad.   The eye is very sensitive to light.   Any liquid comes out of the injured eye after treatment.   Your vision suddenly gets worse.   You have a sudden loss of vision or blindness.  MAKE SURE YOU:   Understand these instructions.   Will watch your condition.   Will get help right away if you are not doing well or get worse.  Document Released: 08/19/2007 Document Revised: 11/12/2010 Document Reviewed: 08/19/2007 [facility] [place] Patient Information 2012 [place], [place].   Take the antibiotic eye drops every 4 hours while you are awake - take your home pain medication as needed for more severe pain - follow up with Dr. Randon Goldsmith with opthamology if you worsen over the next several days - return here with any further concerns.

## 2011-05-07 NOTE — ED Provider Notes (Signed)
History     CSN: [id number]  Arrival date & time 05/07/11  2232   First MD Initiated Contact with Patient 05/07/11 2257      Chief Complaint  Patient presents with  . Eye Problem    (Consider location/radiation/quality/duration/timing/severity/associated sxs/prior treatment) HPI Comments: Patient here after having had surgery on her left ear today - states that they tapped her eyes shut during the surgery and when she awoke in recovery room she began to have left eye pain - she states that she has increased tearing and redness - states has called ENT who did her surgery who told her to come here - reports hydrocodone has not helped with the pain.  Patient is a 46 y.o. female presenting with eye problem. The history is provided by the patient. No language interpreter was used.  Eye Problem  This is a new problem. The current episode started 3 to 5 hours ago. The problem occurs constantly. The problem has not changed since onset.There is pain in the left eye. Injury mechanism: ? tape. The pain is at a severity of 5/10. The pain is moderate. History of Trauma: possible. There is no known exposure to pink eye. She does not wear contacts. Associated symptoms include foreign body sensation and eye redness. Pertinent negatives include no numbness, no blurred vision, no decreased vision, no discharge, no double vision, no photophobia, no nausea, no vomiting, no tingling, no weakness and no itching. She has tried commercial eye wash and eye drops for the symptoms. The treatment provided no relief.    Past Medical History  Diagnosis Date  . DEPRESSION 08/21/2008  . DYSPHAGIA UNSPECIFIED 03/14/2007  . Esophageal reflux 03/14/2007  . GALLSTONES 05/04/2008  . Obesity, unspecified 08/21/2008  . PEPTIC ULCER DISEASE 08/21/2008  . Rosacea 08/21/2008  . UNSPECIFIED SINUSITIS 05/20/2009    Past Surgical History  Procedure Date  . Ruptured bowel repair 1967    Family History  Problem Relation Age of Onset    . Cirrhosis Mother   . Hyperlipidemia Father   . Hypertension Father   . Diabetes Maternal Aunt   . Colon cancer Maternal Uncle     History  Substance Use Topics  . Smoking status: Never Smoker   . Smokeless tobacco: Not on file   Comment: Married, lives with spouse, no kids  . Alcohol Use: Yes    OB History    Grav Para Term Preterm Abortions TAB SAB Ect Mult Living                  Review of Systems  Eyes: Positive for pain and redness. Negative for blurred vision, double vision, photophobia, discharge, itching and visual disturbance.  Gastrointestinal: Negative for nausea and vomiting.  Skin: Negative for itching.  Neurological: Negative for tingling, weakness and numbness.  All other systems reviewed and are negative.    Allergies  Lactose intolerance (gi)  Home Medications   Current Outpatient Rx  Name Route Sig Dispense Refill  . CETIRIZINE HCL 10 MG PO TABS Oral Take 10 mg by mouth daily.      Marland Kitchen DOXYCYCLINE HYCLATE 100 MG PO CAPS Oral Take 100 mg by mouth 2 (two) times daily.      Marland Kitchen ESOMEPRAZOLE MAGNESIUM 40 MG PO CPDR Oral Take 1 capsule (40 mg total) by mouth daily before breakfast. 30 capsule 5  . HYDROCODONE-ACETAMINOPHEN 5-325 MG PO TABS Oral Take 1 tablet by mouth every 6 (six) hours as needed. pain    . TRETINOIN  0.1 % EX CREA Topical Apply topically at bedtime.      Marland Kitchen VITAMIN D 400 UNITS PO TABS Oral Take 800 Units by mouth daily.        BP 151/81  Pulse 107  Temp(Src) 97.9 F (36.6 C) (Oral)  Resp 18  SpO2 100%  Physical Exam  Nursing note and vitals reviewed. Constitutional: She is oriented to person, place, and time. She appears well-developed and well-nourished. No distress.  HENT:  Head: Normocephalic and atraumatic.  Right Ear: External ear normal.  Left Ear: External ear normal.  Nose: Nose normal.  Mouth/Throat: Oropharynx is clear and moist. No oropharyngeal exudate.  Eyes: EOM are normal. Pupils are equal, round, and reactive to  light. Right eye exhibits no chemosis, no discharge and no exudate. Left eye exhibits no chemosis, no discharge and no exudate. Right conjunctiva is not injected. Right conjunctiva has no hemorrhage. Left conjunctiva is not injected. Left conjunctiva has no hemorrhage.  Slit lamp exam:      The right eye shows no corneal abrasion, no corneal flare, no corneal ulcer, no fluorescein uptake and no anterior chamber bulge.       The left eye shows corneal abrasion and fluorescein uptake. The left eye shows no corneal flare, no corneal ulcer and no anterior chamber bulge.    Neck: Normal range of motion. Neck supple.  Cardiovascular: Normal rate, regular rhythm and normal heart sounds.  Exam reveals no gallop and no friction rub.   No murmur heard. Pulmonary/Chest: Effort normal and breath sounds normal. No respiratory distress. She exhibits no tenderness.  Abdominal: Soft. Bowel sounds are normal. She exhibits no distension. There is no tenderness.  Musculoskeletal: Normal range of motion. She exhibits no edema and no tenderness.  Lymphadenopathy:    She has no cervical adenopathy.  Neurological: She is alert and oriented to person, place, and time. No cranial nerve deficit.  Skin: Skin is warm and dry. No rash noted. No erythema. No pallor.  Psychiatric: She has a normal mood and affect. Her behavior is normal. Judgment and thought content normal.    ED Course  Procedures (including critical care time)  Labs Reviewed - No data to display No results found.   Left corneal abrasion     MDM  Patient with left corneal abrasion - started on tobrex 0.3% eye drops here - will follow up with [facility] if needed for further evaluation.        Izola Price [place], [place] 05/07/11 2316

## 2011-05-07 NOTE — ED Notes (Signed)
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 Eye is very inflamed and red.

## 2011-07-01 ENCOUNTER — Other Ambulatory Visit: Payer: Self-pay | Admitting: Dermatology

## 2011-07-31 ENCOUNTER — Other Ambulatory Visit: Payer: Self-pay | Admitting: Internal Medicine

## 2011-09-23 ENCOUNTER — Other Ambulatory Visit: Payer: Self-pay | Admitting: Internal Medicine

## 2011-09-28 ENCOUNTER — Ambulatory Visit: Payer: BC Managed Care – PPO | Admitting: Internal Medicine

## 2011-10-05 ENCOUNTER — Ambulatory Visit (INDEPENDENT_AMBULATORY_CARE_PROVIDER_SITE_OTHER): Payer: BC Managed Care – PPO | Admitting: Internal Medicine

## 2011-10-05 ENCOUNTER — Encounter: Payer: Self-pay | Admitting: Internal Medicine

## 2011-10-05 VITALS — BP 130/82 | HR 90 | Temp 98.0°F | Ht 66.0 in | Wt 202.2 lb

## 2011-10-05 DIAGNOSIS — F411 Generalized anxiety disorder: Secondary | ICD-10-CM

## 2011-10-05 DIAGNOSIS — F419 Anxiety disorder, unspecified: Secondary | ICD-10-CM | POA: Insufficient documentation

## 2011-10-05 DIAGNOSIS — Z0000 Encounter for general adult medical examination without abnormal findings: Secondary | ICD-10-CM

## 2011-10-05 MED ORDER — DESVENLAFAXINE SUCCINATE ER 50 MG PO TB24: 50.0000 mg | ORAL_TABLET | Freq: Every day | ORAL | Status: DC

## 2011-10-05 MED ORDER — ESOMEPRAZOLE MAGNESIUM 40 MG PO CPDR: 40.0000 mg | DELAYED_RELEASE_CAPSULE | Freq: Every day | ORAL | Status: DC

## 2011-10-05 MED ORDER — DOXYCYCLINE HYCLATE 100 MG PO CAPS: 100.0000 mg | ORAL_CAPSULE | Freq: Two times a day (BID) | ORAL | Status: DC

## 2011-10-05 NOTE — Assessment & Plan Note (Signed)
Precipitated by approaching menopause, resuming work and death of mother in law 07/01/11 symptoms ongoing >6 mo Hx same - on sertraline 2009 - ineffective so stopped same would like to try pristiq - we reviewed potential risk/benefit and possible side effects - pt understands and agrees to same  Erx done, follow up in 6 weeks, sooner if problems

## 2011-10-05 NOTE — Progress Notes (Signed)
Subjective:    Patient ID: Courtney Tran, female    DOB: [birth year], 46 y.o.   MRN: [id number]  HPI patient is here today for annual physical. Patient feels well overall.  Notes mild anxiety in past 6 mo - precipitated by return to work and death of mother in law spring 2013 - prior treatment with sertraline ineffective in 2009  Past Medical History  Diagnosis Date  . DEPRESSION   . Esophageal reflux   . GALLSTONES   . Obesity, unspecified   . Rosacea   . Esophageal stricture 08/2005    EGD with dilation   . Uterine fibroid    Family History  Problem Relation Age of Onset  . Cirrhosis Mother   . Hyperlipidemia Father   . Hypertension Father   . Diabetes Maternal Aunt   . Colon cancer Maternal Uncle    History  Substance Use Topics  . Smoking status: Never Smoker   . Smokeless tobacco: Not on file   Comment: Married, lives with spouse, no kids  . Alcohol Use: Yes  working with [employer], contracting 40h/week - marketing  Review of Systems Constitutional: Negative for fever or unexpected weight change.  Respiratory: Negative for cough and shortness of breath.   Cardiovascular: Negative for chest pain or palpitations.  Gastrointestinal: Negative for abdominal pain, no bowel changes.  Musculoskeletal: Negative for gait problem or joint swelling.  Skin: Negative for rash.  Neurological: Negative for dizziness or headache.  No other specific complaints in a complete review of systems (except as listed in HPI above).     Objective:   Physical Exam BP 130/82  Pulse 90  Temp 98 F (36.7 C) (Oral)  Ht 5\' 6"  (1.676 m)  Wt 202 lb 3.2 oz (91.717 kg)  BMI 32.64 kg/m2  SpO2 98% Wt Readings from Last 3 Encounters:  10/05/11 202 lb 3.2 oz (91.717 kg)  03/18/10 196 lb (88.905 kg)  12/31/09 196 lb (88.905 kg)   Constitutional: She is overweight, but appears well-developed and well-nourished. No distress.  HENT: Head: Normocephalic and atraumatic. Ears: B TMs ok, no  erythema or effusion; Nose: Nose normal. Mouth/Throat: Oropharynx is clear and moist. No oropharyngeal exudate.  Eyes: Conjunctivae and EOM are normal. Pupils are equal, round, and reactive to light. No scleral icterus.  Neck: Normal range of motion. Neck supple. No JVD or LAD present. No thyromegaly present.  Cardiovascular: Normal rate, regular rhythm and normal heart sounds.  No murmur heard. No BLE edema. Pulmonary/Chest: Effort normal and breath sounds normal. No respiratory distress. She has no wheezes.  Abdominal: Soft. Bowel sounds are normal. She exhibits no distension. There is no tenderness. no masses Musculoskeletal: Normal range of motion, no joint effusions. No gross deformities Neurological: She is alert and oriented to person, place, and time. No cranial nerve deficit. Coordination normal.  Skin: Skin is warm and dry. No rash noted. No erythema.  Psychiatric: She has a normal mood and mildly anxious affect. Her behavior is normal. Judgment and thought content normal.   Lab Results  Component Value Date   WBC 7.2 09/26/2008   HGB 14.0 09/26/2008   HCT 40.5 09/26/2008   PLT 287.0 09/26/2008   GLUCOSE 92 09/26/2008   CHOL 197 09/26/2008   TRIG 63.0 09/26/2008   HDL 37.50* 09/26/2008   LDLCALC 147* 09/26/2008   ALT 49* 09/26/2008   AST 30 09/26/2008   NA 140 09/26/2008   K 4.3 09/26/2008   CL 108 09/26/2008   CREATININE 0.8  09/26/2008   BUN 11 09/26/2008   CO2 27 09/26/2008   TSH 1.84 09/26/2008       Assessment & Plan:  CPX/v70.0 - Patient has been counseled on age-appropriate routine health concerns for screening and prevention. These are reviewed and up-to-date. Immunizations are up-to-date or declined. Labs ordered and will be reviewed.

## 2011-10-05 NOTE — Patient Instructions (Signed)
It was good to see you today. Health Maintenance reviewed - all recommended immunizations and age-appropriate screenings are up-to-date. Test(s) ordered today. Return when you are fasting this week. Your results will be called to you after review (48-72hours after test completion). If any changes need to be made, you will be notified at that time. Work on lifestyle changes as discussed (low fat, low carb, increased protein diet; improved exercise efforts; weight loss) to control sugar, blood pressure and cholesterol levels and/or reduce risk of developing other medical problems. Look into [email or web address].com.cy or other type of food journal to assist you in this process. Start Pristiq as discussed for anxiety symptoms - Your prescription(s) have been submitted to your pharmacy. Please take as directed and contact our office if you believe you are having problem(s) with the medication(s). Other Medications reviewed, no changes at this time.  Refill on medication(s) as discussed today. Please schedule followup in 6-12 months for mood recheck, call sooner if problems.

## 2011-10-08 ENCOUNTER — Other Ambulatory Visit (INDEPENDENT_AMBULATORY_CARE_PROVIDER_SITE_OTHER): Payer: BC Managed Care – PPO

## 2011-10-08 DIAGNOSIS — Z0000 Encounter for general adult medical examination without abnormal findings: Secondary | ICD-10-CM

## 2011-10-08 LAB — BASIC METABOLIC PANEL
BUN: 10 mg/dL (ref 6–23)
GFR: 68.72 mL/min (ref >60.00)
Potassium: 4.2 mEq/L (ref 3.5–5.1)
Sodium: 138 mEq/L (ref 135–145)

## 2011-10-08 LAB — HEPATIC FUNCTION PANEL
ALT: 30 U/L (ref 0–35)
Total Protein: 6.5 g/dL (ref 6.0–8.3)

## 2011-10-08 LAB — LIPID PANEL
Cholesterol: 221 mg/dL — ABNORMAL HIGH (ref 0–200)
HDL: 49.1 mg/dL (ref >39.00)
Triglycerides: 77 mg/dL (ref 0.0–149.0)

## 2011-10-08 LAB — CBC WITH DIFFERENTIAL/PLATELET
Basophils Relative: 0.6 % (ref 0.0–3.0)
Eosinophils Relative: 2.8 % (ref 0.0–5.0)
HCT: 41.3 % (ref 36.0–46.0)
Hemoglobin: 13.9 g/dL (ref 12.0–15.0)
Lymphocytes Relative: 37.3 % (ref 12.0–46.0)
Lymphs Abs: 2.6 10*3/uL (ref 0.7–4.0)
Monocytes Relative: 6 % (ref 3.0–12.0)
Neutro Abs: 3.8 10*3/uL (ref 1.4–7.7)
RBC: 4.45 Mil/uL (ref 3.87–5.11)

## 2011-10-08 LAB — URINALYSIS, ROUTINE W REFLEX MICROSCOPIC
Ketones, ur: NEGATIVE
Leukocytes, UA: NEGATIVE
Specific Gravity, Urine: >=1.03 (ref 1.000–1.030)
Total Protein, Urine: NEGATIVE
Urine Glucose: NEGATIVE
pH: 5.5 (ref 5.0–8.0)

## 2011-10-08 LAB — TSH: TSH: 1.4 u[IU]/mL (ref 0.35–5.50)

## 2011-10-08 LAB — LDL CHOLESTEROL, DIRECT: Direct LDL: 159.6 mg/dL

## 2012-01-31 ENCOUNTER — Other Ambulatory Visit: Payer: Self-pay | Admitting: Internal Medicine

## 2012-02-25 ENCOUNTER — Other Ambulatory Visit: Payer: Self-pay | Admitting: Dermatology

## 2012-03-15 ENCOUNTER — Other Ambulatory Visit: Payer: Self-pay | Admitting: Internal Medicine

## 2012-04-06 ENCOUNTER — Ambulatory Visit: Payer: BC Managed Care – PPO | Admitting: Internal Medicine

## 2012-04-11 ENCOUNTER — Ambulatory Visit (INDEPENDENT_AMBULATORY_CARE_PROVIDER_SITE_OTHER): Payer: BC Managed Care – PPO | Admitting: Internal Medicine

## 2012-04-11 ENCOUNTER — Encounter: Payer: Self-pay | Admitting: Internal Medicine

## 2012-04-11 VITALS — BP 110/80 | HR 83 | Temp 98.0°F | Ht 66.0 in | Wt 207.8 lb

## 2012-04-11 DIAGNOSIS — E669 Obesity, unspecified: Secondary | ICD-10-CM

## 2012-04-11 DIAGNOSIS — F419 Anxiety disorder, unspecified: Secondary | ICD-10-CM

## 2012-04-11 DIAGNOSIS — F411 Generalized anxiety disorder: Secondary | ICD-10-CM

## 2012-04-11 MED ORDER — LORCASERIN HCL 10 MG PO TABS: 10.0000 mg | ORAL_TABLET | Freq: Two times a day (BID) | ORAL | Status: DC

## 2012-04-11 NOTE — Progress Notes (Signed)
  Subjective:    Patient ID: Courtney Tran, female    DOB: 21-Jul-1965, 47 y.o.   MRN: [id number]  HPI  patient is here today for follow up -   mild anxiety in since 2013 - precipitated by return to work and death of mother in law spring 2013, unexpected death of pet 04-02-12 - prior treatment with sertraline ineffective in 2009 - began SNRI - Pristique 09/2011 - the patient reports compliance with medication(s) as prescribed. Denies adverse side effects.   Obesity - would like to try Belviq - has voucher for 15d free -never on her prescriptive therapy for same. Working on increased attention to diet and exercise, but no specific commitment yet  Past Medical History  Diagnosis Date  . DEPRESSION   . Esophageal reflux   . GALLSTONES   . Obesity, unspecified   . Rosacea   . Esophageal stricture 08/2005    EGD with dilation   . Uterine fibroid     History  Substance Use Topics  . Smoking status: Never Smoker   . Smokeless tobacco: Not on file     Comment: Married, lives with spouse, no kids  . Alcohol Use: Yes  working with [employer], contracting 40h/week - marketing  Review of Systems  Constitutional: Negative for fever or unexpected weight change.  Respiratory: Negative for cough and shortness of breath.   Cardiovascular: Negative for chest pain or palpitations.      Objective:   Physical Exam  BP 110/80  Pulse 83  Temp 98 F (36.7 C) (Oral)  Ht 5\' 6"  (1.676 m)  Wt 207 lb 12.8 oz (94.257 kg)  BMI 33.54 kg/m2  SpO2 96% Wt Readings from Last 3 Encounters:  04/11/12 207 lb 12.8 oz (94.257 kg)  10/05/11 202 lb 3.2 oz (91.717 kg)  03/18/10 196 lb (88.905 kg)   Constitutional: She is overweight, but appears well-developed and well-nourished. No distress.  Neck: Normal range of motion. Neck supple. No JVD or LAD present. No thyromegaly present.  Cardiovascular: Normal rate, regular rhythm and normal heart sounds.  No murmur heard. No BLE edema. Pulmonary/Chest: Effort  normal and breath sounds normal. No respiratory distress. She has no wheezes.  Psychiatric: She has a normal mood and minamally anxious affect. Her behavior is normal. Judgment and thought content normal.   Lab Results  Component Value Date   WBC 7.1 10/08/2011   HGB 13.9 10/08/2011   HCT 41.3 10/08/2011   PLT 319.0 10/08/2011   GLUCOSE 94 10/08/2011   CHOL 221* 10/08/2011   TRIG 77.0 10/08/2011   HDL 49.10 10/08/2011   LDLDIRECT 159.6 10/08/2011   LDLCALC 147* 09/26/2008   ALT 30 10/08/2011   AST 18 10/08/2011   NA 138 10/08/2011   K 4.2 10/08/2011   CL 107 10/08/2011   CREATININE 0.9 10/08/2011   BUN 10 10/08/2011   CO2 26 10/08/2011   TSH 1.40 10/08/2011       Assessment & Plan:   See problem list. Medications and labs reviewed today.

## 2012-04-11 NOTE — Assessment & Plan Note (Signed)
Precipitated by approaching menopause, resuming work and death of mother in law 06/18/2011; also unexpected death of pet 04-19-12 Hx same - on sertraline 2009 - ineffective so stopped same on pristiq since 09/2011 - improved The current medical regimen is effective;  continue present plan and medications.

## 2012-04-11 NOTE — Patient Instructions (Signed)
It was good to see you today. Medications reviewed and updated, ok to try Belviq for weight loss as discussed -  Your prescription(s) have been given to you to submit to your pharmacy. Please take as directed and contact our office if you believe you are having problem(s) with the medication(s). Please schedule followup in 6 months for medical physical and labs, call sooner if problems.

## 2012-04-11 NOTE — Assessment & Plan Note (Addendum)
Wt Readings from Last 3 Encounters:  04/11/12 207 lb 12.8 oz (94.257 kg)  10/05/11 202 lb 3.2 oz (91.717 kg)  03/18/10 196 lb (88.905 kg)   The patient is asked to make an attempt to improve diet and exercise patterns to aid in medical management of this problem. She would like try Belviq - 15d sample voucher in hnd - written rx provided to try, further rx to be determined by response to therapy

## 2012-04-26 ENCOUNTER — Other Ambulatory Visit: Payer: Self-pay | Admitting: Internal Medicine

## 2012-04-29 ENCOUNTER — Other Ambulatory Visit: Payer: Self-pay | Admitting: Internal Medicine

## 2012-05-24 ENCOUNTER — Other Ambulatory Visit: Payer: Self-pay | Admitting: *Deleted

## 2012-05-24 MED ORDER — LORCASERIN HCL 10 MG PO TABS: 10.0000 mg | ORAL_TABLET | Freq: Two times a day (BID) | ORAL | Status: DC

## 2012-05-24 NOTE — Telephone Encounter (Signed)
Md out of office. Is this ok to refill.../lmb 

## 2012-05-24 NOTE — Telephone Encounter (Signed)
Called pt no answer LMOM rx ready for pick-up.../lmb 

## 2012-05-25 ENCOUNTER — Encounter: Payer: Self-pay | Admitting: Internal Medicine

## 2012-05-26 MED ORDER — LORCASERIN HCL 10 MG PO TABS: 10.0000 mg | ORAL_TABLET | Freq: Two times a day (BID) | ORAL | Status: DC

## 2012-05-26 NOTE — Telephone Encounter (Signed)
rx done as requested - signed for pick up

## 2012-06-08 ENCOUNTER — Other Ambulatory Visit: Payer: Self-pay | Admitting: *Deleted

## 2012-06-08 ENCOUNTER — Encounter: Payer: Self-pay | Admitting: Internal Medicine

## 2012-06-08 NOTE — Telephone Encounter (Signed)
Received fax pt needing PA on Belviq. Contacted insurance faxing over PA form to be completed...Raechel Chute

## 2012-06-09 NOTE — Telephone Encounter (Signed)
Received PA form has been completed and fax back waiting on approval status...Raechel Chute

## 2012-06-09 NOTE — Telephone Encounter (Signed)
Received PA med has been approve. Notified [facility] with approval status.left.lmb

## 2012-09-15 ENCOUNTER — Encounter: Payer: Self-pay | Admitting: Internal Medicine

## 2012-10-03 ENCOUNTER — Telehealth: Payer: Self-pay | Admitting: Internal Medicine

## 2012-10-03 DIAGNOSIS — Z0000 Encounter for general adult medical examination without abnormal findings: Secondary | ICD-10-CM

## 2012-10-03 NOTE — Telephone Encounter (Signed)
Pt called stating that she has an appt with Dr. Felicity Coyer 11/10/12 and pt was wondering if Dr. Felicity Coyer would like for her to do lab work a week prior. Please call pt.

## 2012-10-03 NOTE — Telephone Encounter (Signed)
Called pt no answer LMOM md response...lmb 

## 2012-10-03 NOTE — Telephone Encounter (Signed)
Ok - labs entered for 1 week prior - thanks

## 2012-10-10 ENCOUNTER — Ambulatory Visit: Payer: BC Managed Care – PPO | Admitting: Internal Medicine

## 2012-11-03 ENCOUNTER — Encounter: Payer: Self-pay | Admitting: Internal Medicine

## 2012-11-07 ENCOUNTER — Ambulatory Visit (INDEPENDENT_AMBULATORY_CARE_PROVIDER_SITE_OTHER): Payer: BC Managed Care – PPO

## 2012-11-07 DIAGNOSIS — Z0000 Encounter for general adult medical examination without abnormal findings: Secondary | ICD-10-CM

## 2012-11-07 LAB — URINALYSIS, ROUTINE W REFLEX MICROSCOPIC
Bilirubin Urine: NEGATIVE
Ketones, ur: NEGATIVE
Nitrite: NEGATIVE
Total Protein, Urine: NEGATIVE
pH: 6 (ref 5.0–8.0)

## 2012-11-07 LAB — BASIC METABOLIC PANEL
BUN: 13 mg/dL (ref 6–23)
CO2: 24 mEq/L (ref 19–32)
Calcium: 8.8 mg/dL (ref 8.4–10.5)
Chloride: 110 mEq/L (ref 96–112)
Creatinine, Ser: 0.8 mg/dL (ref 0.4–1.2)

## 2012-11-07 LAB — LIPID PANEL
Cholesterol: 223 mg/dL — ABNORMAL HIGH (ref 0–200)
Total CHOL/HDL Ratio: 6
Triglycerides: 120 mg/dL (ref 0.0–149.0)
VLDL: 24 mg/dL (ref 0.0–40.0)

## 2012-11-07 LAB — CBC WITH DIFFERENTIAL/PLATELET
Basophils Absolute: 0 10*3/uL (ref 0.0–0.1)
HCT: 41.5 % (ref 36.0–46.0)
Hemoglobin: 14.3 g/dL (ref 12.0–15.0)
Lymphs Abs: 2.7 10*3/uL (ref 0.7–4.0)
MCHC: 34.5 g/dL (ref 30.0–36.0)
MCV: 91.6 fl (ref 78.0–100.0)
Monocytes Absolute: 0.4 10*3/uL (ref 0.1–1.0)
Platelets: 267 10*3/uL (ref 150.0–400.0)
RBC: 4.52 Mil/uL (ref 3.87–5.11)
RDW: 13.1 % (ref 11.5–14.6)
WBC: 7.6 10*3/uL (ref 4.5–10.5)

## 2012-11-07 LAB — HEPATIC FUNCTION PANEL
ALT: 33 U/L (ref 0–35)
Bilirubin, Direct: 0.1 mg/dL (ref 0.0–0.3)
Total Protein: 7 g/dL (ref 6.0–8.3)

## 2012-11-08 LAB — TSH: TSH: 0.97 u[IU]/mL (ref 0.35–5.50)

## 2012-11-10 ENCOUNTER — Ambulatory Visit (INDEPENDENT_AMBULATORY_CARE_PROVIDER_SITE_OTHER): Payer: BC Managed Care – PPO | Admitting: Internal Medicine

## 2012-11-10 ENCOUNTER — Encounter: Payer: Self-pay | Admitting: Internal Medicine

## 2012-11-10 VITALS — BP 118/72 | HR 72 | Temp 98.4°F | Wt 212.5 lb

## 2012-11-10 DIAGNOSIS — Z23 Encounter for immunization: Secondary | ICD-10-CM

## 2012-11-10 DIAGNOSIS — D259 Leiomyoma of uterus, unspecified: Secondary | ICD-10-CM | POA: Insufficient documentation

## 2012-11-10 DIAGNOSIS — Z0000 Encounter for general adult medical examination without abnormal findings: Secondary | ICD-10-CM

## 2012-11-10 DIAGNOSIS — E669 Obesity, unspecified: Secondary | ICD-10-CM

## 2012-11-10 DIAGNOSIS — E785 Hyperlipidemia, unspecified: Secondary | ICD-10-CM

## 2012-11-10 MED ORDER — PHENTERMINE-TOPIRAMATE ER 3.75-23 MG PO CP24: 1.0000 | ORAL_CAPSULE | Freq: Every morning | ORAL | Status: DC

## 2012-11-10 MED ORDER — PHENTERMINE-TOPIRAMATE ER 7.5-46 MG PO CP24: 1.0000 | ORAL_CAPSULE | Freq: Every morning | ORAL | Status: DC

## 2012-11-10 NOTE — Assessment & Plan Note (Signed)
Wt Readings from Last 3 Encounters:  11/10/12 212 lb 8 oz (96.389 kg)  04/11/12 207 lb 12.8 oz (94.257 kg)  10/05/11 202 lb 3.2 oz (91.717 kg)   The patient is asked to make an attempt to improve diet and exercise patterns to aid in medical management of this problem. s/p 3 mo Belviq - no significant weight changes Now try Qysmia -written rx provided to try, further rx to be determined by response to therapy

## 2012-11-10 NOTE — Patient Instructions (Signed)
It was good to see you today. We have reviewed your prior records including labs and tests today Health Maintenance reviewed -schedule your mammogram as discussed, flu shot given today - all other recommended immunizations and age-appropriate screenings are up-to-date. Medications reviewed and updated, will start Qsymia for weight loss help Work on lifestyle changes as discussed (low fat, low carb, increased protein diet; improved exercise efforts; weight loss) to control sugar, blood pressure and cholesterol levels and/or reduce risk of developing other medical problems. Look into LimitLaws.com.cy or other type of food journal to assist you in this process. Follow up with gynecology about the uterine fibroids as discussed Please schedule followup in 3-6 months for weight and cholesterol check, call sooner if problems.

## 2012-11-10 NOTE — Progress Notes (Signed)
Subjective:    Patient ID: Courtney Tran, female    DOB: [birth year], 47 y.o.   MRN: [id number]  HPI patient is here today for annual physical -   Also reviewed chronic medical issues and interval medical events -   mild anxiety in since 2013 - precipitated by return to work and death of mother in law spring 2013, unexpected death of pet 04-20-2012 - prior treatment with sertraline ineffective in 2009. Began SNRI (Pristiq) 09/2011 - the patient reports compliance with medication(s) as prescribed. Denies adverse side effects.   Obesity - s/p 3 mo trial Belviq spring 2014 - ineffective changes in weight. Working on increased attention to diet and exercise, but no specific commitment yet - ?other medication  Heavy menstral periods - associated with fatigue and cramping - known fibroids, now ready for hysterectomy and planning follow up with gyn for same  Past Medical History  Diagnosis Date  . DEPRESSION   . Esophageal reflux   . GALLSTONES   . Obesity, unspecified   . Rosacea   . Esophageal stricture 08/2005    EGD with dilation   . Uterine fibroid    Family History  Problem Relation Age of Onset  . Cirrhosis Mother   . Hyperlipidemia Father   . Hypertension Father   . Diabetes Maternal Aunt   . Colon cancer Maternal Uncle    History  Substance Use Topics  . Smoking status: Never Smoker   . Smokeless tobacco: Not on file     Comment: Married, lives with spouse, no kids  . Alcohol Use: Yes  working with [employer], contracting 40h/week - marketing  Review of Systems Constitutional: Negative for fever or weight change.  Respiratory: Negative for cough and shortness of breath.   Cardiovascular: Negative for chest pain or palpitations.  Gastrointestinal: Negative for abdominal pain, no bowel changes.  Musculoskeletal: Negative for gait problem or joint swelling.  Skin: Negative for rash.  Neurological: Negative for dizziness or headache.  No other specific complaints in a  complete review of systems (except as listed in HPI above).     Objective:   Physical Exam BP 118/72  Pulse 72  Temp(Src) 98.4 F (36.9 C) (Oral)  Wt 212 lb 8 oz (96.389 kg)  BMI 34.31 kg/m2  SpO2 98% Wt Readings from Last 3 Encounters:  11/10/12 212 lb 8 oz (96.389 kg)  04/11/12 207 lb 12.8 oz (94.257 kg)  10/05/11 202 lb 3.2 oz (91.717 kg)   Constitutional: She is overweight, but appears well-developed and well-nourished. No distress.  HENT: Head: Normocephalic and atraumatic. Ears: B TMs ok, no erythema or effusion; Nose: Nose normal. Mouth/Throat: Oropharynx is clear and moist. No oropharyngeal exudate.  Eyes: Conjunctivae and EOM are normal. Pupils are equal, round, and reactive to light. No scleral icterus.  Neck: Thick. Normal range of motion. Neck supple. No JVD present. No thyromegaly present.  Cardiovascular: Normal rate, regular rhythm and normal heart sounds.  No murmur heard. No BLE edema. Pulmonary/Chest: Effort normal and breath sounds normal. No respiratory distress. She has no wheezes.  Abdominal: Soft. Bowel sounds are normal. She exhibits no distension. There is no tenderness. no masses GU: defer to gyn Musculoskeletal: Normal range of motion, no joint effusions. No gross deformities Neurological: She is alert and oriented to person, place, and time. No cranial nerve deficit. Coordination, balance, strength, speech and gait are normal.  Skin: Skin is warm and dry. No rash noted. No erythema.  Psychiatric: She has a normal mood  and affect. Her behavior is normal. Judgment and thought content normal.    Lab Results  Component Value Date   WBC 7.6 11/07/2012   HGB 14.3 11/07/2012   HCT 41.5 11/07/2012   PLT 267.0 11/07/2012   GLUCOSE 104* 11/07/2012   CHOL 223* 11/07/2012   TRIG 120.0 11/07/2012   HDL 38.00* 11/07/2012   LDLDIRECT 175.8 11/07/2012   LDLCALC 147* 09/26/2008   ALT 33 11/07/2012   AST 25 11/07/2012   NA 136 11/07/2012   K 3.9 11/07/2012   CL 110  11/07/2012   CREATININE 0.8 11/07/2012   BUN 13 11/07/2012   CO2 24 11/07/2012   TSH 0.97 11/07/2012       Assessment & Plan:   CPX/v70.0 - Patient has been counseled on age-appropriate routine health concerns for screening and prevention. These are reviewed and up-to-date. Immunizations are up-to-date or declined. Labs reviewed.  Also see problem list. Medications and labs reviewed today.

## 2012-11-10 NOTE — Assessment & Plan Note (Signed)
The patient is asked to make an attempt to improve diet and exercise patterns to aid in medical management of this problem. Declines statin for 1 prevention -  See plans for weight loss as discussed above Recheck 6-64mo and reconsider meds if unsuccessful diet/weight changes

## 2012-11-10 NOTE — Assessment & Plan Note (Signed)
follow up gyn as planned

## 2012-11-11 ENCOUNTER — Other Ambulatory Visit: Payer: Self-pay | Admitting: *Deleted

## 2012-11-11 NOTE — Telephone Encounter (Signed)
Received fax pt needing PA on her Qsymia. Completed PA on cover-my-meds waiting on approval status.../lmb 

## 2012-11-15 NOTE — Telephone Encounter (Signed)
Received PA back med has been approve notified pharmacy spoke with Tresa Endo gave her approval status...lmb

## 2012-11-23 ENCOUNTER — Other Ambulatory Visit: Payer: Self-pay

## 2012-11-23 DIAGNOSIS — Z1231 Encounter for screening mammogram for malignant neoplasm of breast: Secondary | ICD-10-CM

## 2012-11-24 ENCOUNTER — Ambulatory Visit
Admission: RE | Admit: 2012-11-24 | Discharge: 2012-11-24 | Disposition: A | Discharge status: Home / Self Care | Payer: BC Managed Care – PPO | Source: Ambulatory Visit

## 2012-11-24 DIAGNOSIS — Z1231 Encounter for screening mammogram for malignant neoplasm of breast: Secondary | ICD-10-CM

## 2012-11-28 ENCOUNTER — Other Ambulatory Visit: Payer: Self-pay | Admitting: *Deleted

## 2012-11-28 ENCOUNTER — Encounter: Payer: Self-pay | Admitting: Internal Medicine

## 2012-11-28 MED ORDER — DESVENLAFAXINE SUCCINATE ER 50 MG PO TB24: ORAL_TABLET | ORAL | Status: DC

## 2012-11-28 NOTE — Telephone Encounter (Signed)
Pt sent email needing refill on her pristiq sent to [facility]...Raechel Chute

## 2012-11-29 ENCOUNTER — Other Ambulatory Visit: Payer: Self-pay | Admitting: Obstetrics & Gynecology

## 2012-11-29 DIAGNOSIS — R928 Other abnormal and inconclusive findings on diagnostic imaging of breast: Secondary | ICD-10-CM

## 2012-12-07 ENCOUNTER — Encounter: Payer: Self-pay | Admitting: Internal Medicine

## 2012-12-07 ENCOUNTER — Other Ambulatory Visit: Payer: Self-pay | Admitting: Internal Medicine

## 2012-12-08 NOTE — Telephone Encounter (Signed)
Reply back to pt email stating rx was ready for pick-up...lmb

## 2012-12-13 ENCOUNTER — Ambulatory Visit
Admission: RE | Admit: 2012-12-13 | Discharge: 2012-12-13 | Disposition: A | Discharge status: Home / Self Care | Payer: BC Managed Care – PPO | Source: Ambulatory Visit | Attending: Obstetrics & Gynecology | Admitting: Obstetrics & Gynecology

## 2012-12-13 DIAGNOSIS — R928 Other abnormal and inconclusive findings on diagnostic imaging of breast: Secondary | ICD-10-CM

## 2013-01-07 ENCOUNTER — Telehealth: Payer: Self-pay | Admitting: Internal Medicine

## 2013-01-09 MED ORDER — PHENTERMINE-TOPIRAMATE ER 7.5-46 MG PO CP24: 1.0000 | ORAL_CAPSULE | Freq: Every day | ORAL | Status: DC

## 2013-01-09 NOTE — Telephone Encounter (Signed)
md sent email letting pt kno rx ready for pick-up. Place script up front for pick-up...lmb

## 2013-02-03 ENCOUNTER — Encounter: Payer: Self-pay | Admitting: Internal Medicine

## 2013-02-03 MED ORDER — PHENTERMINE-TOPIRAMATE ER 7.5-46 MG PO CP24: 1.0000 | ORAL_CAPSULE | Freq: Every day | ORAL | Status: DC

## 2013-02-13 LAB — HM PAP SMEAR

## 2013-02-24 ENCOUNTER — Other Ambulatory Visit: Payer: Self-pay | Admitting: Dermatology

## 2013-02-24 ENCOUNTER — Other Ambulatory Visit: Payer: Self-pay | Admitting: Obstetrics & Gynecology

## 2013-02-28 ENCOUNTER — Encounter (HOSPITAL_COMMUNITY): Payer: Self-pay | Admitting: Pharmacist

## 2013-03-03 ENCOUNTER — Encounter (HOSPITAL_COMMUNITY): Payer: Self-pay

## 2013-03-03 ENCOUNTER — Encounter (HOSPITAL_COMMUNITY)
Admission: RE | Admit: 2013-03-03 | Discharge: 2013-03-03 | Disposition: A | Discharge status: Home / Self Care | Payer: BC Managed Care – PPO | Source: Ambulatory Visit | Attending: Obstetrics & Gynecology | Admitting: Obstetrics & Gynecology

## 2013-03-03 LAB — CBC
HCT: 39.5 % (ref 36.0–46.0)
Hemoglobin: 13.8 g/dL (ref 12.0–15.0)
MCH: 31.2 pg (ref 26.0–34.0)
MCHC: 34.9 g/dL (ref 30.0–36.0)
MCV: 89.4 fL (ref 78.0–100.0)
RBC: 4.42 MIL/uL (ref 3.87–5.11)
WBC: 7.6 10*3/uL (ref 4.0–10.5)

## 2013-03-03 NOTE — Progress Notes (Signed)
Pt has Veneers on her teeth.

## 2013-03-03 NOTE — Patient Instructions (Addendum)
Your procedure is scheduled on: 03/06/2013  Enter through the Main Entrance of [facility] at: 0600AM  Pick up the phone at the desk and dial [phone].  Call this number if you have problems the morning of surgery: [phone].  Remember: Do NOT eat food: AFTER MIDNIGHT SUNDAY Do NOT drink clear liquids after: AFTER MIDNIGHT SUNDAY Take these medicines the morning of surgery with a SIP OF WATER: NEXIUM  Do NOT wear jewelry (body piercing), make-up, or nail polish. Do NOT wear lotions, powders, or perfumes.  You may wear deoderant. Do NOT shave for 48 hours prior to surgery. Do NOT bring valuables to the hospital. Contacts, dentures, or bridgework may not be worn into surgery. Leave suitcase in car.  After surgery it may be brought to your room.  For patients admitted to the hospital, checkout time is 11:00 AM the day of discharge.

## 2013-03-05 NOTE — Anesthesia Preprocedure Evaluation (Addendum)
Anesthesia Evaluation  Patient identified by MRN, date of birth, ID band Patient awake    Reviewed: Allergy & Precautions, H&P , NPO status , Patient's Chart, lab work & pertinent test results  Airway Mallampati: II TM Distance: >3 FB Neck ROM: Full    Dental  (+) Dental Advisory Given, Caps and Teeth Intact   Pulmonary neg pulmonary ROS,  breath sounds clear to auscultation        Cardiovascular negative cardio ROS  Rhythm:Regular Rate:Normal     Neuro/Psych PSYCHIATRIC DISORDERS Anxiety Depression negative neurological ROS     GI/Hepatic Neg liver ROS, GERD-  Medicated,  Endo/Other  negative endocrine ROS  Renal/GU negative Renal ROS     Musculoskeletal negative musculoskeletal ROS (+)   Abdominal (+) + obese,   Peds  Hematology negative hematology ROS (+)   Anesthesia Other Findings   Reproductive/Obstetrics negative OB ROS                          Anesthesia Physical Anesthesia Plan  ASA: II  Anesthesia Plan: General   Post-op Pain Management:    Induction: Intravenous  Airway Management Planned: Oral ETT  Additional Equipment:   Intra-op Plan:   Post-operative Plan: Extubation in OR  Informed Consent: I have reviewed the patients History and Physical, chart, labs and discussed the procedure including the risks, benefits and alternatives for the proposed anesthesia with the patient or authorized representative who has indicated his/her understanding and acceptance.   Dental advisory given  Plan Discussed with: CRNA  Anesthesia Plan Comments:         Anesthesia Quick Evaluation

## 2013-03-06 ENCOUNTER — Ambulatory Visit (HOSPITAL_COMMUNITY)
Admission: RE | Admit: 2013-03-06 | Discharge: 2013-03-06 | Disposition: A | Discharge status: Home / Self Care | Payer: BC Managed Care – PPO | Source: Ambulatory Visit | Attending: Obstetrics & Gynecology | Admitting: Obstetrics & Gynecology

## 2013-03-06 ENCOUNTER — Encounter (HOSPITAL_COMMUNITY): Payer: BC Managed Care – PPO | Admitting: Anesthesiology

## 2013-03-06 ENCOUNTER — Ambulatory Visit (HOSPITAL_COMMUNITY): Payer: BC Managed Care – PPO | Admitting: Anesthesiology

## 2013-03-06 ENCOUNTER — Encounter (HOSPITAL_COMMUNITY): Payer: Self-pay | Admitting: *Deleted

## 2013-03-06 ENCOUNTER — Encounter (HOSPITAL_COMMUNITY)
Admission: RE | Disposition: A | Discharge status: Home / Self Care | Payer: Self-pay | Source: Ambulatory Visit | Attending: Obstetrics & Gynecology

## 2013-03-06 DIAGNOSIS — N8000 Endometriosis of the uterus, unspecified: Secondary | ICD-10-CM | POA: Insufficient documentation

## 2013-03-06 DIAGNOSIS — D252 Subserosal leiomyoma of uterus: Secondary | ICD-10-CM | POA: Insufficient documentation

## 2013-03-06 DIAGNOSIS — Z9889 Other specified postprocedural states: Secondary | ICD-10-CM

## 2013-03-06 DIAGNOSIS — N920 Excessive and frequent menstruation with regular cycle: Secondary | ICD-10-CM | POA: Insufficient documentation

## 2013-03-06 DIAGNOSIS — N840 Polyp of corpus uteri: Secondary | ICD-10-CM | POA: Insufficient documentation

## 2013-03-06 DIAGNOSIS — N949 Unspecified condition associated with female genital organs and menstrual cycle: Secondary | ICD-10-CM | POA: Insufficient documentation

## 2013-03-06 DIAGNOSIS — N72 Inflammatory disease of cervix uteri: Secondary | ICD-10-CM | POA: Insufficient documentation

## 2013-03-06 DIAGNOSIS — R109 Unspecified abdominal pain: Secondary | ICD-10-CM | POA: Insufficient documentation

## 2013-03-06 HISTORY — PX: ROBOTIC ASSISTED TOTAL HYSTERECTOMY: SHX6085

## 2013-03-06 HISTORY — PX: ABDOMINAL HYSTERECTOMY: SHX81

## 2013-03-06 LAB — COMPREHENSIVE METABOLIC PANEL
ALT: 36 U/L — ABNORMAL HIGH (ref 0–35)
AST: 25 U/L (ref 0–37)
Alkaline Phosphatase: 66 U/L (ref 39–117)
BUN: 10 mg/dL (ref 6–23)
CO2: 27 mEq/L (ref 19–32)
Chloride: 105 mEq/L (ref 96–112)
Creatinine, Ser: 0.84 mg/dL (ref 0.50–1.10)
GFR calc Af Amer: >90 mL/min (ref >90)
GFR calc non Af Amer: 81 mL/min — ABNORMAL LOW (ref >90)
Glucose, Bld: 103 mg/dL — ABNORMAL HIGH (ref 70–99)
Potassium: 4.4 mEq/L (ref 3.5–5.1)
Sodium: 138 mEq/L (ref 135–145)
Total Bilirubin: 0.4 mg/dL (ref 0.3–1.2)

## 2013-03-06 LAB — TYPE AND SCREEN: ABO/RH(D): B POS

## 2013-03-06 LAB — CBC
HCT: 39.5 % (ref 36.0–46.0)
Hemoglobin: 13.8 g/dL (ref 12.0–15.0)
MCV: 90.2 fL (ref 78.0–100.0)
Platelets: 274 10*3/uL (ref 150–400)
RDW: 12.9 % (ref 11.5–15.5)
WBC: 14 10*3/uL — ABNORMAL HIGH (ref 4.0–10.5)

## 2013-03-06 LAB — ABO/RH: ABO/RH(D): B POS

## 2013-03-06 SURGERY — ROBOTIC ASSISTED TOTAL HYSTERECTOMY
Anesthesia: General | Site: Abdomen

## 2013-03-06 MED ORDER — ROCURONIUM BROMIDE 100 MG/10ML IV SOLN
INTRAVENOUS | Status: DC | PRN
  Administered 2013-03-06 (×3): 10 mg via INTRAVENOUS
  Administered 2013-03-06: 40 mg via INTRAVENOUS

## 2013-03-06 MED ORDER — ONDANSETRON HCL 4 MG/2ML IJ SOLN
INTRAMUSCULAR | Status: AC
  Filled 2013-03-06: qty 2

## 2013-03-06 MED ORDER — KETOROLAC TROMETHAMINE 30 MG/ML IJ SOLN
INTRAMUSCULAR | Status: DC | PRN
  Administered 2013-03-06: 30 mg via INTRAVENOUS

## 2013-03-06 MED ORDER — GLYCOPYRROLATE 0.2 MG/ML IJ SOLN
INTRAMUSCULAR | Status: DC | PRN
  Administered 2013-03-06: .6 mg via INTRAVENOUS

## 2013-03-06 MED ORDER — GLYCOPYRROLATE 0.2 MG/ML IJ SOLN
INTRAMUSCULAR | Status: AC
  Filled 2013-03-06: qty 3

## 2013-03-06 MED ORDER — NEOSTIGMINE METHYLSULFATE 1 MG/ML IJ SOLN
INTRAMUSCULAR | Status: DC | PRN
  Administered 2013-03-06: 3 mg via INTRAVENOUS

## 2013-03-06 MED ORDER — LACTATED RINGERS IV SOLN
INTRAVENOUS | Status: DC
  Administered 2013-03-06: 10:00:00 via INTRAVENOUS
  Administered 2013-03-06: 125 mL/h via INTRAVENOUS

## 2013-03-06 MED ORDER — IBUPROFEN 600 MG PO TABS: 600.0000 mg | ORAL_TABLET | Freq: Four times a day (QID) | ORAL | Status: DC | PRN

## 2013-03-06 MED ORDER — ACETAMINOPHEN 10 MG/ML IV SOLN
1000.0000 mg | Freq: Once | INTRAVENOUS | Status: AC
  Administered 2013-03-06: 1000 mg via INTRAVENOUS
  Filled 2013-03-06: qty 100

## 2013-03-06 MED ORDER — PROPOFOL 10 MG/ML IV EMUL
INTRAVENOUS | Status: AC
  Filled 2013-03-06: qty 20

## 2013-03-06 MED ORDER — MIDAZOLAM HCL 2 MG/2ML IJ SOLN
INTRAMUSCULAR | Status: DC | PRN
  Administered 2013-03-06: 2 mg via INTRAVENOUS

## 2013-03-06 MED ORDER — MIDAZOLAM HCL 2 MG/2ML IJ SOLN
INTRAMUSCULAR | Status: AC
  Filled 2013-03-06: qty 2

## 2013-03-06 MED ORDER — LIDOCAINE HCL (CARDIAC) 20 MG/ML IV SOLN
INTRAVENOUS | Status: DC | PRN
  Administered 2013-03-06: 80 mg via INTRAVENOUS

## 2013-03-06 MED ORDER — KETOROLAC TROMETHAMINE 30 MG/ML IJ SOLN
INTRAMUSCULAR | Status: AC
  Filled 2013-03-06: qty 1

## 2013-03-06 MED ORDER — BUPIVACAINE HCL (PF) 0.25 % IJ SOLN
INTRAMUSCULAR | Status: AC
  Filled 2013-03-06: qty 20

## 2013-03-06 MED ORDER — FENTANYL CITRATE 0.05 MG/ML IJ SOLN
INTRAMUSCULAR | Status: AC
  Filled 2013-03-06: qty 2

## 2013-03-06 MED ORDER — LIDOCAINE HCL (CARDIAC) 20 MG/ML IV SOLN
INTRAVENOUS | Status: AC
  Filled 2013-03-06: qty 5

## 2013-03-06 MED ORDER — OXYCODONE-ACETAMINOPHEN 5-325 MG PO TABS
1.0000 | ORAL_TABLET | ORAL | Status: DC | PRN
  Administered 2013-03-06 (×2): 2 via ORAL
  Filled 2013-03-06 (×2): qty 2

## 2013-03-06 MED ORDER — ROCURONIUM BROMIDE 100 MG/10ML IV SOLN
INTRAVENOUS | Status: AC
  Filled 2013-03-06: qty 1

## 2013-03-06 MED ORDER — MEPERIDINE HCL 25 MG/ML IJ SOLN: 6.2500 mg | INTRAMUSCULAR | Status: DC | PRN

## 2013-03-06 MED ORDER — BUPIVACAINE HCL (PF) 0.25 % IJ SOLN
INTRAMUSCULAR | Status: AC
  Filled 2013-03-06: qty 10

## 2013-03-06 MED ORDER — DEXAMETHASONE SODIUM PHOSPHATE 10 MG/ML IJ SOLN
INTRAMUSCULAR | Status: AC
  Filled 2013-03-06: qty 1

## 2013-03-06 MED ORDER — LACTATED RINGERS IR SOLN
Status: DC | PRN
  Administered 2013-03-06: 3000 mL

## 2013-03-06 MED ORDER — FENTANYL CITRATE 0.05 MG/ML IJ SOLN
25.0000 ug | INTRAMUSCULAR | Status: DC | PRN
  Administered 2013-03-06: 50 ug via INTRAVENOUS

## 2013-03-06 MED ORDER — HYDROMORPHONE HCL PF 1 MG/ML IJ SOLN
INTRAMUSCULAR | Status: AC
  Filled 2013-03-06: qty 1

## 2013-03-06 MED ORDER — FENTANYL CITRATE 0.05 MG/ML IJ SOLN
INTRAMUSCULAR | Status: AC
  Filled 2013-03-06: qty 5

## 2013-03-06 MED ORDER — DEXAMETHASONE SODIUM PHOSPHATE 10 MG/ML IJ SOLN
INTRAMUSCULAR | Status: DC | PRN
  Administered 2013-03-06: 10 mg via INTRAVENOUS

## 2013-03-06 MED ORDER — BUPIVACAINE HCL (PF) 0.25 % IJ SOLN
INTRAMUSCULAR | Status: DC | PRN
  Administered 2013-03-06: 20 mL

## 2013-03-06 MED ORDER — FENTANYL CITRATE 0.05 MG/ML IJ SOLN
INTRAMUSCULAR | Status: DC | PRN
  Administered 2013-03-06 (×5): 50 ug via INTRAVENOUS

## 2013-03-06 MED ORDER — CEFAZOLIN SODIUM-DEXTROSE 2-3 GM-% IV SOLR
INTRAVENOUS | Status: AC
  Filled 2013-03-06: qty 50

## 2013-03-06 MED ORDER — OXYCODONE-ACETAMINOPHEN 7.5-325 MG PO TABS: 1.0000 | ORAL_TABLET | ORAL | Status: DC | PRN

## 2013-03-06 MED ORDER — METOCLOPRAMIDE HCL 5 MG/ML IJ SOLN: 10.0000 mg | Freq: Once | INTRAMUSCULAR | Status: DC | PRN

## 2013-03-06 MED ORDER — LACTATED RINGERS IV SOLN
INTRAVENOUS | Status: DC
  Administered 2013-03-06: 13:00:00 via INTRAVENOUS

## 2013-03-06 MED ORDER — HYDROMORPHONE HCL PF 1 MG/ML IJ SOLN: 1.0000 mg | INTRAMUSCULAR | Status: DC | PRN

## 2013-03-06 MED ORDER — ONDANSETRON HCL 4 MG/2ML IJ SOLN
INTRAMUSCULAR | Status: DC | PRN
  Administered 2013-03-06: 4 mg via INTRAVENOUS

## 2013-03-06 MED ORDER — HYDROMORPHONE HCL PF 1 MG/ML IJ SOLN
INTRAMUSCULAR | Status: DC | PRN
  Administered 2013-03-06: 1 mg via INTRAVENOUS

## 2013-03-06 MED ORDER — CEFAZOLIN SODIUM-DEXTROSE 2-3 GM-% IV SOLR
2.0000 g | INTRAVENOUS | Status: AC
  Administered 2013-03-06: 2 g via INTRAVENOUS

## 2013-03-06 MED ORDER — PROPOFOL 10 MG/ML IV BOLUS
INTRAVENOUS | Status: DC | PRN
  Administered 2013-03-06: 200 mg via INTRAVENOUS

## 2013-03-06 MED ORDER — NEOSTIGMINE METHYLSULFATE 1 MG/ML IJ SOLN
INTRAMUSCULAR | Status: AC
  Filled 2013-03-06: qty 1

## 2013-03-06 SURGICAL SUPPLY — 63 items
ADH SKN CLS APL DERMABOND .7 (GAUZE/BANDAGES/DRESSINGS) ×1
BAG URINE DRAINAGE (UROLOGICAL SUPPLIES) ×2 IMPLANT
BARRIER ADHS 3X4 INTERCEED (GAUZE/BANDAGES/DRESSINGS) ×2 IMPLANT
BRR ADH 4X3 ABS CNTRL BYND (GAUZE/BANDAGES/DRESSINGS) ×1
CATH FOLEY 3WAY  5CC 16FR (CATHETERS) ×1
CATH FOLEY 3WAY 5CC 16FR (CATHETERS) ×1 IMPLANT
CLOTH BEACON ORANGE TIMEOUT ST (SAFETY) ×2 IMPLANT
CONT PATH 16OZ SNAP LID 3702 (MISCELLANEOUS) ×2 IMPLANT
COVER MAYO STAND STRL (DRAPES) ×2 IMPLANT
COVER TABLE BACK 60X90 (DRAPES) ×4 IMPLANT
COVER TIP SHEARS 8 DVNC (MISCELLANEOUS) ×1 IMPLANT
COVER TIP SHEARS 8MM DA VINCI (MISCELLANEOUS) ×1
DECANTER SPIKE VIAL GLASS SM (MISCELLANEOUS) ×2 IMPLANT
DERMABOND ADVANCED (GAUZE/BANDAGES/DRESSINGS) ×1
DERMABOND ADVANCED .7 DNX12 (GAUZE/BANDAGES/DRESSINGS) ×2 IMPLANT
DRAPE HUG U DISPOSABLE (DRAPE) ×2 IMPLANT
DRAPE LG THREE QUARTER DISP (DRAPES) ×4 IMPLANT
DRAPE WARM FLUID 44X44 (DRAPE) ×2 IMPLANT
ELECT REM PT RETURN 9FT ADLT (ELECTROSURGICAL) ×2
ELECTRODE REM PT RTRN 9FT ADLT (ELECTROSURGICAL) ×1 IMPLANT
EVACUATOR SMOKE 8.L (FILTER) ×2 IMPLANT
GAUZE VASELINE 3X9 (GAUZE/BANDAGES/DRESSINGS) IMPLANT
GLOVE BIO SURGEON STRL SZ 6.5 (GLOVE) ×2 IMPLANT
GLOVE BIO SURGEON STRL SZ7 (GLOVE) ×2 IMPLANT
GLOVE BIOGEL PI IND STRL 7.0 (GLOVE) ×1 IMPLANT
GLOVE BIOGEL PI INDICATOR 7.0 (GLOVE) ×1
GOWN STRL REIN XL XLG (GOWN DISPOSABLE) ×12 IMPLANT
IV STOPCOCK 4 WAY 40  W/Y SET (IV SOLUTION)
IV STOPCOCK 4 WAY 40 W/Y SET (IV SOLUTION) IMPLANT
KIT ACCESSORY DA VINCI DISP (KITS) ×1
KIT ACCESSORY DVNC DISP (KITS) ×1 IMPLANT
LEGGING LITHOTOMY PAIR STRL (DRAPES) ×2 IMPLANT
NEEDLE HYPO 22GX1.5 SAFETY (NEEDLE) IMPLANT
OCCLUDER COLPOPNEUMO (BALLOONS) ×1 IMPLANT
PACK LAVH (CUSTOM PROCEDURE TRAY) ×2 IMPLANT
PAD PREP 24X48 CUFFED NSTRL (MISCELLANEOUS) ×4 IMPLANT
PLUG CATH AND CAP STER (CATHETERS) ×2 IMPLANT
PROTECTOR NERVE ULNAR (MISCELLANEOUS) ×4 IMPLANT
SET CYSTO W/LG BORE CLAMP LF (SET/KITS/TRAYS/PACK) IMPLANT
SET IRRIG TUBING LAPAROSCOPIC (IRRIGATION / IRRIGATOR) ×4 IMPLANT
SOLUTION ELECTROLUBE (MISCELLANEOUS) ×2 IMPLANT
SUT VIC AB 0 CT1 27 (SUTURE) ×4
SUT VIC AB 0 CT1 27XBRD ANBCTR (SUTURE) ×2 IMPLANT
SUT VIC AB 4-0 PS2 27 (SUTURE) ×4 IMPLANT
SUT VICRYL 0 UR6 27IN ABS (SUTURE) ×4 IMPLANT
SUT VLOC 180 0 9IN  GS21 (SUTURE) ×1
SUT VLOC 180 0 9IN GS21 (SUTURE) IMPLANT
SYR 50ML LL SCALE MARK (SYRINGE) ×2 IMPLANT
SYSTEM CONVERTIBLE TROCAR (TROCAR) ×1 IMPLANT
TIP RUMI ORANGE 6.7MMX12CM (TIP) IMPLANT
TIP UTERINE 5.1X6CM LAV DISP (MISCELLANEOUS) IMPLANT
TIP UTERINE 6.7X10CM GRN DISP (MISCELLANEOUS) IMPLANT
TIP UTERINE 6.7X6CM WHT DISP (MISCELLANEOUS) IMPLANT
TIP UTERINE 6.7X8CM BLUE DISP (MISCELLANEOUS) IMPLANT
TOWEL OR 17X24 6PK STRL BLUE (TOWEL DISPOSABLE) ×6 IMPLANT
TROCAR 12M 150ML BLUNT (TROCAR) ×1 IMPLANT
TROCAR DISP BLADELESS 8 DVNC (TROCAR) ×1 IMPLANT
TROCAR DISP BLADELESS 8MM (TROCAR) ×1
TROCAR XCEL 12X100 BLDLESS (ENDOMECHANICALS) ×1 IMPLANT
TROCAR XCEL NON-BLD 5MMX100MML (ENDOMECHANICALS) ×2 IMPLANT
TUBING FILTER THERMOFLATOR (ELECTROSURGICAL) ×4 IMPLANT
WARMER LAPAROSCOPE (MISCELLANEOUS) ×2 IMPLANT
WATER STERILE IRR 1000ML POUR (IV SOLUTION) ×6 IMPLANT

## 2013-03-06 NOTE — Preoperative (Signed)
Beta Blockers   Reason not to administer Beta Blockers:Not Applicable 

## 2013-03-06 NOTE — Anesthesia Postprocedure Evaluation (Signed)
  Anesthesia Post-op Note  Patient: Courtney Tran  Procedure(s) Performed: Procedure(s): ROBOTIC ASSISTED TOTAL HYSTERECTOMY WITH BILATERAL SALPINGECTOMY (N/A)  Patient Location: PACU and Women's Unit  Anesthesia Type:General  Level of Consciousness: awake and oriented  Airway and Oxygen Therapy: Patient Spontanous Breathing and Patient connected to nasal cannula oxygen  Post-op Pain: mild  Post-op Assessment: Post-op Vital signs reviewed and Patient's Cardiovascular Status Stable  Post-op Vital Signs: Reviewed and stable  Complications: No apparent anesthesia complications

## 2013-03-06 NOTE — Op Note (Addendum)
03/06/2013  10:47 AM  PATIENT:  Courtney Tran  47 y.o. female  PRE-OPERATIVE DIAGNOSIS:  Symptomatic Myomas, Menorrhagia, Pelvic Pain  POST-OPERATIVE DIAGNOSIS:  Symptomatic Myomas,Menorrhagia, Pelvic Pain and bowel adhesions  PROCEDURE:  Procedure(s): ROBOTIC ASSISTED TOTAL HYSTERECTOMY WITH BILATERAL SALPINGECTOMIES AND LYSIS OF ADHESIONS  SURGEON:  Surgeon(s): Genia Del, MD  ASSISTANTS: Denton Meek   ANESTHESIA:   general  PROCEDURE:  Under general anesthesia with endotracheal intubation the patient is in lithotomy position. She is am prepped with ChloraPrep on the abdomen and with Betadine on the suprapubic, vulvar and vaginal areas. She is draped as usual. Vaginally the weighted speculum is inserted and the anterior lip of the cervix is grasped with a tenaculum. The hysterometry is at 9 cm so we choose a #8 roomy with a medium coring, those are put in place easily. We removed the tagged tenaculum and removed the weighted speculum. The Foley is inserted in the bladder. We go to the abdomen. A supraumbilical incision is made with a scalpel over 1.5 cm after infiltration of Marcaine one quarter plain. The aponeurosis is opened under direct vision with Mayo scissors. The peritoneum is opened bluntly with a finger. A pursestring stitch of Vicryl 0 is done on the aponeurosis. We then inserted the [facility] at that level the camera after creating a pneumoperitoneum with CO2. No adhesion was present on the anterior abdominal wall were the ports will be placed. We used a semicircular configuration for port placement.  All sites were infiltrated with Marcaine one quarter plain and a small incision was done with the scalpel. All ports were inserted under direct vision. 2 robotic ports on the right side. One robotic ports on the left lower side. The assistant port a 5 mm ports on the left upper side.  The robot his docked easily. Instruments are inserted under direct vision with the Endo  Shears scissor on the right arm, the PK in the left arm and the fenestrated clamp on the third arm.  We go to the console.  Inspection of the abdominopelvic cavities reveals mild adhesion in the upper abdomen between the omentum and the anterior wall.  Severe adhesions between the large bowel and the left abdomino-pelvic walsl are present.  The bowel is also adherent to the right posterior aspect of the uterus.  Both ureters are normal anatomy position.  We start with lysis of adhesions of the bowels with the left abdomino-pelvic walls and then lysis of adhesions between the bowel and the right posterior uterus.  We then cauterized and sectioned the left round ligament, we cauterized and sectioned the left mesosalpinx and we cauterized and sectioned the left utero-ovarian ligament after freeing the left ovary from the left aspect of the uterus.  The visceral peritoneum was opened anteriorly and we started descending the bladder.  We proceed exactly the same way on the right side.  A low subserosal myoma was present anteriorly, but were not able to descend the bladder further, well past the coring none-the-less.  We then cauterized the left uterine artery. We cauterized the right uterine artery and sectioned it. We went back to the left side and sectioned the left uterine artery. The uterus was blanching. We opened the top of the vagina over the coring posteriorly with the tip of the Endo Shears scissor, then to the right side, anteriorly and finished on the left side.  We partially detached the low anterior myoma to be able to pass the uterus vaginally.  The occluder was  put back in place vaginally. The specimen was a little above 200 g.  We then irrigated and suctioned the pelvic cavities, confirming good hemostasis.  We switched the instruments to the cutting needle driver in the right arm, the long tip in the left arm and the PK in the third arm.  A V. Lock 0 was used to close the cuff.  We started at the right  angle and did a running suture reaching the left angle and then coming back on our steps for 2 bites.  The needle was parked on the right lower abdominal wall.  Hemostasis was adequate at all levels.  We   therefore removed robotic instruments.  We undocked the robot.  And went to laparoscopy time. An 8 mm camera was used. The needle was removed from the abdomen easily. We confirmed good hemostasis once again.  Any fluid left was suctioned.  We removed laparoscopy instruments.  The ports were removed under direct vision.  The CO2 was evacuated.  The pursestring stitch was attached at the supraumbilical incision.  Hemostasis was completed with the Bovie at the subcutaneous tissue.  We closed all incisions with a subcuticular stitch of Vicryl 4-0.  We added Dermabond.  The occluder was removed from the vagina. Hemostasis was adequate at that level as well. The patient was brought to recovery room in good and stable status.  ESTIMATED BLOOD LOSS: 100 cc   Intake/Output Summary (Last 24 hours) at 03/06/13 1047 Last data filed at 03/06/13 0957  Gross per 24 hour  Intake   1000 ml  Output      0 ml  Net   1000 ml     BLOOD ADMINISTERED:none   LOCAL MEDICATIONS USED:  MARCAINE     SPECIMEN:  Source of Specimen:  Uterus with cervix and tubes  DISPOSITION OF SPECIMEN:  PATHOLOGY  COUNTS:  YES  PLAN OF CARE: Transfer to PACU   Genia Del MD  03/06/2013 at 10:47 am

## 2013-03-06 NOTE — H&P (Signed)
Courtney Tran is an 47 y.o. female G0  RP:  Uterine myomas with pelvic pain and menorrhagia  Pertinent Gynecological History: Menses: flow is excessive with use of many pads or tampons on heaviest days Bleeding: intermenstrual bleeding Blood transfusions: none Sexually transmitted diseases: no past history Previous GYN Procedures: none  Last mammogram: normal  Last pap: normal  OB History: G0   Menstrual History:  Patient's last menstrual period was 03/02/2013.    Past Medical History  Diagnosis Date  . DEPRESSION   . Esophageal reflux   . GALLSTONES   . Obesity, unspecified   . Rosacea   . Esophageal stricture 08/2005    EGD with dilation   . Uterine fibroid   . Dyslipidemia   . Cancer     Recent Basal Cell Removal R. Forehead, R arm, Left leg    Past Surgical History  Procedure Laterality Date  . Ruptured bowel repair  1967  . Wisdom tooth extraction    . External ear surgery      Family History  Problem Relation Age of Onset  . Cirrhosis Mother   . Hyperlipidemia Father   . Hypertension Father   . Diabetes Maternal Aunt   . Colon cancer Maternal Uncle     Social History:  reports that she has never smoked. She does not have any smokeless tobacco history on file. She reports that she drinks alcohol. She reports that she does not use illicit drugs.  Allergies:  Allergies  Allergen Reactions  . Lactose Intolerance (Gi)     Prescriptions prior to admission  Medication Sig Dispense Refill  . B Complex-C (B-COMPLEX WITH VITAMIN C) tablet Take 1 tablet by mouth daily.      . cetirizine (ZYRTEC) 10 MG tablet Take 10 mg by mouth daily.        . cholecalciferol (VITAMIN D) 1000 UNITS tablet Take 1,000 Units by mouth daily.      Marland Kitchen desvenlafaxine (PRISTIQ) 50 MG 24 hr tablet TAKE ONE TABLET BY MOUTH ONE TIME DAILY  30 tablet  5  . doxycycline (VIBRA-TABS) 100 MG tablet TAKE ONE TABLET BY MOUTH TWICE DAILY  60 tablet  4  . NEXIUM 40 MG capsule TAKE ONE  CAPSULE BY MOUTH EVERY DAY BEFORE BREAKFAST  30 capsule  5  . Prenatal Vit-Fe Fumarate-FA (PRENATAL MULTIVITAMIN) TABS tablet Take 1 tablet by mouth daily at 12 noon.      . Thiamine HCl (VITAMIN B-1) 250 MG tablet Take 250 mg by mouth daily.      Marland Kitchen tretinoin (RETIN-A) 0.1 % cream Apply topically at bedtime.        . vitamin E 100 UNIT capsule Take 100 Units by mouth.      . cyanocobalamin 100 MCG tablet Take 100 mcg by mouth daily.      . Phentermine-Topiramate 7.5-46 MG CP24 Take 1 capsule by mouth daily. Patient stopped taking for upcoming procedure        ROS  Blood pressure 114/76, pulse 69, temperature 97.7 F (36.5 C), temperature source Oral, resp. rate 18, last menstrual period 03/02/2013, SpO2 99.00%. Physical Exam  Uterine volume 153 cc by [place]   Results for orders placed during the hospital encounter of 03/06/13 (from the past 24 hour(s))  PREGNANCY, URINE     Status: None   Collection Time    03/06/13  6:15 AM      Result Value Range   Preg Test, Ur NEGATIVE  NEGATIVE    No  results found.  Assessment/Plan: Pelvic pain/menorrhagia with uterine myomas for Robotic TLH/salpingectomies.  Surgery and risks reviewed.  Kahleel Fadeley,MARIE-LYNE 03/06/2013, 7:49 AM

## 2013-03-06 NOTE — Progress Notes (Signed)
Pt ambulated out. Teaching given. Escorted home by spouse.

## 2013-03-06 NOTE — Discharge Summary (Signed)
  Physician Discharge Summary  Patient ID: Courtney Tran MRN: [id number] DOB/AGE: Dec 21, 1965 47 y.o.  Admit date: 03/06/2013 Discharge date: 03/06/2013  Admission Diagnoses: Symptomatic Myomas Menorrhagia Pelvic Pain  78295  Discharge Diagnoses: Symptomatic Myomas Menorrhagia Pelvic Pain  62130        Active Problems:   * No active hospital problems. *   Discharged Condition: good  Hospital Course: good  Consults: None  Treatments: surgery: Robotic total laparoscopic hysterectomy with bilateral salpingectomies  Disposition: 01-Home or Self Care   Future Appointments Provider Department Dept Phone   03/13/2013 10:00 AM Newt Lukes, MD [facility] [phone]       Medication List         B-complex with vitamin C tablet  Take 1 tablet by mouth daily.     cetirizine 10 MG tablet  Commonly known as:  ZYRTEC  Take 10 mg by mouth daily.     cholecalciferol 1000 UNITS tablet  Commonly known as:  VITAMIN D  Take 1,000 Units by mouth daily.     cyanocobalamin 100 MCG tablet  Take 100 mcg by mouth daily.     desvenlafaxine 50 MG 24 hr tablet  Commonly known as:  PRISTIQ  TAKE ONE TABLET BY MOUTH ONE TIME DAILY     doxycycline 100 MG tablet  Commonly known as:  VIBRA-TABS  TAKE ONE TABLET BY MOUTH TWICE DAILY     NEXIUM 40 MG capsule  Generic drug:  esomeprazole  TAKE ONE CAPSULE BY MOUTH EVERY DAY BEFORE BREAKFAST     Phentermine-Topiramate 7.5-46 MG Cp24  Take 1 capsule by mouth daily. Patient stopped taking for upcoming procedure     prenatal multivitamin Tabs tablet  Take 1 tablet by mouth daily at 12 noon.     tretinoin 0.1 % cream  Commonly known as:  RETIN-A  Apply topically at bedtime.     vitamin B-1 250 MG tablet  Take 250 mg by mouth daily.     vitamin E 100 UNIT capsule  Take 100 Units by mouth.           Follow-up Information   Follow up with Philbert Ocallaghan,MARIE-LYNE, MD In 3 weeks.   Specialty:   Obstetrics and Gynecology   Contact information:   [address] [place] [address] [phone]       Signed: Genia Del, MD 03/06/2013, 10:45 AM

## 2013-03-06 NOTE — Transfer of Care (Signed)
Immediate Anesthesia Transfer of Care Note  Patient: Courtney Tran  Procedure(s) Performed: Procedure(s): ROBOTIC ASSISTED TOTAL HYSTERECTOMY WITH BILATERAL SALPINGECTOMY (N/A)  Patient Location: PACU  Anesthesia Type:General  Level of Consciousness: awake, alert  and oriented  Airway & Oxygen Therapy: Patient Spontanous Breathing and Patient connected to nasal cannula oxygen  Post-op Assessment: Report given to PACU RN, Post -op Vital signs reviewed and stable and Patient moving all extremities  Post vital signs: Reviewed and stable  Complications: No apparent anesthesia complications

## 2013-03-06 NOTE — Anesthesia Postprocedure Evaluation (Signed)
  Anesthesia Post-op Note  Anesthesia Post Note  Patient: Courtney Tran  Procedure(s) Performed: Procedure(s) (LRB): ROBOTIC ASSISTED TOTAL HYSTERECTOMY WITH BILATERAL SALPINGECTOMY (N/A)  Anesthesia type: General  Patient location: PACU  Post pain: Pain level controlled  Post assessment: Post-op Vital signs reviewed  Last Vitals:  Filed Vitals:   03/06/13 1145  BP: 129/77  Pulse: 89  Temp: 36.8 C  Resp: 14    Post vital signs: Reviewed  Level of consciousness: sedated  Complications: No apparent anesthesia complications

## 2013-03-07 ENCOUNTER — Encounter: Payer: Self-pay | Admitting: Internal Medicine

## 2013-03-07 ENCOUNTER — Encounter (HOSPITAL_COMMUNITY): Payer: Self-pay | Admitting: Obstetrics & Gynecology

## 2013-03-13 ENCOUNTER — Ambulatory Visit: Payer: BC Managed Care – PPO | Admitting: Internal Medicine

## 2013-04-03 ENCOUNTER — Ambulatory Visit (INDEPENDENT_AMBULATORY_CARE_PROVIDER_SITE_OTHER): Payer: BC Managed Care – PPO | Admitting: Internal Medicine

## 2013-04-03 ENCOUNTER — Encounter: Payer: Self-pay | Admitting: Internal Medicine

## 2013-04-03 VITALS — BP 120/92 | HR 66 | Temp 97.9°F | Wt 207.1 lb

## 2013-04-03 DIAGNOSIS — F419 Anxiety disorder, unspecified: Secondary | ICD-10-CM

## 2013-04-03 DIAGNOSIS — J329 Chronic sinusitis, unspecified: Secondary | ICD-10-CM

## 2013-04-03 DIAGNOSIS — E669 Obesity, unspecified: Secondary | ICD-10-CM

## 2013-04-03 DIAGNOSIS — F411 Generalized anxiety disorder: Secondary | ICD-10-CM

## 2013-04-03 MED ORDER — PHENTERMINE-TOPIRAMATE ER 15-92 MG PO CP24: 1.0000 | ORAL_CAPSULE | Freq: Every day | ORAL | Status: DC

## 2013-04-03 MED ORDER — AZITHROMYCIN 250 MG PO TABS: ORAL_TABLET | ORAL | Status: DC

## 2013-04-03 MED ORDER — DOXYCYCLINE HYCLATE 100 MG PO TABS: ORAL_TABLET | ORAL | Status: DC

## 2013-04-03 NOTE — Assessment & Plan Note (Signed)
Precipitated by symptoms of menopause, resuming work and death of mother in law 18-May-2011; also unexpected death of pet 19-Mar-2012 Hx same - on sertraline 2009 - ineffective so stopped same on pristiq since 09/2011 - improved and stable The current medical regimen is effective;  continue present plan and medications.

## 2013-04-03 NOTE — Assessment & Plan Note (Signed)
Wt Readings from Last 3 Encounters:  04/03/13 207 lb 1.9 oz (93.949 kg)  03/06/13 212 lb (96.163 kg)  03/06/13 212 lb (96.163 kg)   The patient is asked to make an attempt to improve diet and exercise patterns to aid in medical management of this problem. s/p 3 mo Belviq - no significant weight changes trial Qysmia began 10/2012  (start weight 212#)-  no sig weight loss but also not on max dose Titrate now and monitor - follow up 6-12 weeks on same for weight check

## 2013-04-03 NOTE — Progress Notes (Signed)
Pre-visit discussion using our clinic review tool. No additional management support is needed unless otherwise documented below in the visit note.  

## 2013-04-03 NOTE — Progress Notes (Signed)
Subjective:    Patient ID: Courtney Tran, female    DOB: 21-Nov-1965, 48 y.o.   MRN: [id number]  HPI  Patient here today for follow up.  Complains of sinus drainage for the last 3 days, green in color, no fever, positive chills, no cough.     Chronic medical issues also reviewed.  Anxiety - mild since 2013 precipitated by return to work and death of mother in law spring 2013, unexpected death of pet April 15, 2012 - prior treatment with sertraline ineffective in 2009. Began SNRI (Pristiq) 09/2011 - the patient reports compliance with medication(s) as prescribed. Denies adverse side effects.   Obesity - s/p 3 mo trial Belviq spring 2014, then began Qysmia 10/2012 -? ineffective changes in weight, complicated by recent surg and decreased ability to exercise. Working on increased attention to diet and exercise, but no specific commitment yet -   Heavy menstrual periods - s/p hysterectomy 02/2013 with Dr Dellis Filbert.  Recovering well  Dyslipidemia - elevated 2014, unwilling to start medications 10/2012, willing to work on diet and exercise.    Past Medical History  Diagnosis Date  . DEPRESSION   . Esophageal reflux   . GALLSTONES   . Obesity, unspecified   . Rosacea   . Esophageal stricture 08/2005    EGD with dilation   . Uterine fibroid   . Dyslipidemia   . Cancer     Recent Basal Cell Removal R. Forehead, R arm, Left leg    Review of Systems  Constitutional: Negative for fever, chills, activity change and appetite change.  HENT: Positive for congestion, postnasal drip, rhinorrhea and sinus pressure. Negative for sore throat.   Eyes: Negative for pain, discharge and itching.  Respiratory: Negative for cough, chest tightness, shortness of breath and wheezing.   Cardiovascular: Negative for chest pain, palpitations and leg swelling.  Gastrointestinal: Negative for nausea, vomiting, diarrhea, constipation and abdominal distention.  Skin: Negative for rash.  Neurological: Negative for  dizziness, syncope and headaches.  Psychiatric/Behavioral: Negative for sleep disturbance and dysphoric mood. The patient is not nervous/anxious.        Objective:   Physical Exam  Vitals reviewed. Constitutional: She is oriented to person, place, and time. No distress.  overweight  HENT:  Head: Normocephalic and atraumatic.  Right Ear: External ear normal.  Left Ear: External ear normal.  Nose: Right sinus exhibits maxillary sinus tenderness. Left sinus exhibits maxillary sinus tenderness.  Mouth/Throat: Oropharynx is clear and moist. No oropharyngeal exudate.  Eyes: Conjunctivae and EOM are normal. Pupils are equal, round, and reactive to light. Right eye exhibits no discharge. Left eye exhibits no discharge.  Neck: Normal range of motion. Neck supple. No thyromegaly present.  Cardiovascular: Normal rate, regular rhythm and normal heart sounds.   No murmur heard. Pulmonary/Chest: Effort normal and breath sounds normal. No respiratory distress. She has no wheezes. She exhibits no tenderness.  Abdominal: Soft. Bowel sounds are normal. She exhibits no distension. There is no tenderness.  Musculoskeletal: Normal range of motion. She exhibits no edema and no tenderness.  Lymphadenopathy:    She has no cervical adenopathy.  Neurological: She is alert and oriented to person, place, and time.  Skin: Skin is warm and dry. No rash noted. She is not diaphoretic.  Abdominal incisions from hysterectomy without redness or exudate  Psychiatric: She has a normal mood and affect. Her behavior is normal. Judgment and thought content normal.    BP 120/92  Pulse 66  Temp(Src) 97.9 F (36.6 C) (  Oral)  Wt 207 lb 1.9 oz (93.949 kg)  SpO2 99% Wt Readings from Last 3 Encounters:  04/03/13 207 lb 1.9 oz (93.949 kg)  03/06/13 212 lb (96.163 kg)  03/06/13 212 lb (96.163 kg)   BP Readings from Last 3 Encounters:  04/03/13 120/92  03/06/13 125/93  03/06/13 125/93   Lab Results  Component Value  Date   WBC 14.0* 03/06/2013   HGB 13.8 03/06/2013   HCT 39.5 03/06/2013   PLT 274 03/06/2013   GLUCOSE 103* 03/06/2013   CHOL 223* 11/07/2012   TRIG 120.0 11/07/2012   HDL 38.00* 11/07/2012   LDLDIRECT 175.8 11/07/2012   LDLCALC 147* 09/26/2008   ALT 36* 03/06/2013   AST 25 03/06/2013   NA 138 03/06/2013   K 4.4 03/06/2013   CL 105 03/06/2013   CREATININE 0.84 03/06/2013   BUN 10 03/06/2013   CO2 27 03/06/2013   TSH 0.97 11/07/2012       Assessment & Plan:    Acute sinusitis -  Explained lack of efficacy for antibiotics and Allegra viral disease, but empiric Z-Pak provided for patient to use if symptoms ongoing/worse in next 7-10d  See problem list -

## 2013-04-03 NOTE — Patient Instructions (Signed)
It was good to see you today.  Medications reviewed and updated  ok to continue Qsymia at max dose for weight loss as discussed - Must be down at leats 5% of body weight in next 12 weeks to continue this medication  If you develop worsening symptoms or fever, call and we can reconsider antibiotics, but it does not appear necessary to use antibiotics at this time. Z-Pak paper prescription provided to you to use in fill if symptoms worse or unimproved after 7-10 days on over-the-counter therapy  Your prescription(s) have been given to you to submit to your pharmacy. Please take as directed and contact our office if you believe you are having problem(s) with the medication(s).  Please schedule followup in 6 months for medical physical and labs, call sooner if problems.

## 2013-05-05 ENCOUNTER — Telehealth: Payer: Self-pay | Admitting: Internal Medicine

## 2013-05-08 ENCOUNTER — Encounter: Payer: Self-pay | Admitting: Internal Medicine

## 2013-05-08 ENCOUNTER — Encounter: Payer: Self-pay | Admitting: *Deleted

## 2013-05-08 MED ORDER — PHENTERMINE-TOPIRAMATE ER 15-92 MG PO CP24: 1.0000 | ORAL_CAPSULE | Freq: Every day | ORAL | Status: DC

## 2013-05-08 NOTE — Telephone Encounter (Signed)
Ok - note need for screening with [facility] prior to picking up refill (if not already done)  

## 2013-05-08 NOTE — Telephone Encounter (Signed)
Place rx up front for pick-up along with controlled contract to be sign...Courtney Tran

## 2013-06-01 ENCOUNTER — Encounter: Payer: Self-pay | Admitting: Internal Medicine

## 2013-06-03 ENCOUNTER — Other Ambulatory Visit: Payer: Self-pay | Admitting: Internal Medicine

## 2013-06-07 ENCOUNTER — Encounter: Payer: Self-pay | Admitting: Internal Medicine

## 2013-06-07 ENCOUNTER — Telehealth: Payer: Self-pay | Admitting: *Deleted

## 2013-06-07 ENCOUNTER — Telehealth: Payer: Self-pay | Admitting: Internal Medicine

## 2013-06-07 MED ORDER — PHENTERMINE-TOPIRAMATE ER 15-92 MG PO CP24: 1.0000 | ORAL_CAPSULE | Freq: Every day | ORAL | Status: DC

## 2013-06-07 NOTE — Telephone Encounter (Signed)
Received fax pt needing PA on her Qsymia. Completed PA on [employer]-med[employer] waiting on approval status...Johny Chess

## 2013-06-09 NOTE — Telephone Encounter (Signed)
Received PA back med has been denied. Place on md desk to review...Johny Chess

## 2013-06-10 NOTE — Telephone Encounter (Signed)
Let pt know her insurance has denied continued use of Qysmia because the medication has not been effective for her since starting it 10/2012 - This is determined based on lack of weight loss -  Her insurance requires loss of 5% or more of body weight in 6 months of starting med (did not happen) No other meds recommended -continue diet and exercise as ongoing

## 2013-06-12 NOTE — Telephone Encounter (Signed)
Called pt no answer LMOM (cell) with md response...Courtney Tran

## 2013-06-13 ENCOUNTER — Encounter: Payer: Self-pay | Admitting: Internal Medicine

## 2013-07-03 ENCOUNTER — Ambulatory Visit: Payer: BC Managed Care – PPO | Admitting: Internal Medicine

## 2013-07-10 ENCOUNTER — Encounter: Payer: Self-pay | Admitting: Internal Medicine

## 2013-07-10 ENCOUNTER — Other Ambulatory Visit: Payer: Self-pay | Admitting: *Deleted

## 2013-07-10 MED ORDER — DESVENLAFAXINE SUCCINATE ER 50 MG PO TB24: ORAL_TABLET | ORAL | Status: DC

## 2013-07-10 NOTE — Telephone Encounter (Signed)
Sent email wanting refill on her pristiq...Courtney Tran

## 2013-07-17 ENCOUNTER — Ambulatory Visit: Payer: BC Managed Care – PPO | Admitting: Internal Medicine

## 2013-10-04 ENCOUNTER — Other Ambulatory Visit: Payer: Self-pay | Admitting: Internal Medicine

## 2013-11-06 ENCOUNTER — Other Ambulatory Visit: Payer: Self-pay

## 2013-11-06 DIAGNOSIS — Z1231 Encounter for screening mammogram for malignant neoplasm of breast: Secondary | ICD-10-CM

## 2013-11-27 ENCOUNTER — Ambulatory Visit
Admission: RE | Admit: 2013-11-27 | Discharge: 2013-11-27 | Disposition: A | Discharge status: Home / Self Care | Payer: BC Managed Care – PPO | Source: Ambulatory Visit

## 2013-11-27 DIAGNOSIS — Z1231 Encounter for screening mammogram for malignant neoplasm of breast: Secondary | ICD-10-CM

## 2013-12-28 ENCOUNTER — Other Ambulatory Visit: Payer: Self-pay | Admitting: Internal Medicine

## 2014-02-05 ENCOUNTER — Other Ambulatory Visit: Payer: Self-pay | Admitting: Internal Medicine

## 2014-02-06 ENCOUNTER — Other Ambulatory Visit: Payer: Self-pay

## 2014-02-06 MED ORDER — DESVENLAFAXINE SUCCINATE ER 50 MG PO TB24
50.0000 mg | ORAL_TABLET | Freq: Every day | ORAL | Status: DC
Start: 2014-02-06 — End: 2014-03-01

## 2014-02-24 ENCOUNTER — Encounter: Payer: Self-pay | Admitting: Internal Medicine

## 2014-03-01 ENCOUNTER — Other Ambulatory Visit: Payer: Self-pay

## 2014-03-01 MED ORDER — DESVENLAFAXINE SUCCINATE ER 50 MG PO TB24: 50.0000 mg | ORAL_TABLET | Freq: Every day | ORAL | Status: DC

## 2014-03-01 MED ORDER — DOXYCYCLINE HYCLATE 100 MG PO TABS: 100.0000 mg | ORAL_TABLET | Freq: Two times a day (BID) | ORAL | Status: DC

## 2014-03-26 ENCOUNTER — Other Ambulatory Visit: Payer: Self-pay | Admitting: Dermatology

## 2014-03-28 ENCOUNTER — Encounter: Payer: Self-pay | Admitting: Internal Medicine

## 2014-04-03 ENCOUNTER — Other Ambulatory Visit: Payer: Self-pay | Admitting: Internal Medicine

## 2014-04-03 MED ORDER — DESVENLAFAXINE SUCCINATE ER 50 MG PO TB24: 50.0000 mg | ORAL_TABLET | Freq: Every day | ORAL | Status: DC

## 2014-05-23 ENCOUNTER — Ambulatory Visit (INDEPENDENT_AMBULATORY_CARE_PROVIDER_SITE_OTHER): Payer: BLUE CROSS/BLUE SHIELD | Admitting: Internal Medicine

## 2014-05-23 ENCOUNTER — Encounter: Payer: Self-pay | Admitting: Internal Medicine

## 2014-05-23 VITALS — BP 122/84 | HR 65 | Temp 97.6°F | Ht 65.0 in | Wt 211.5 lb

## 2014-05-23 DIAGNOSIS — Z0000 Encounter for general adult medical examination without abnormal findings: Secondary | ICD-10-CM

## 2014-05-23 MED ORDER — DESVENLAFAXINE SUCCINATE ER 50 MG PO TB24: 50.0000 mg | ORAL_TABLET | Freq: Every day | ORAL | Status: DC

## 2014-05-23 MED ORDER — ESOMEPRAZOLE MAGNESIUM 40 MG PO CPDR: 40.0000 mg | DELAYED_RELEASE_CAPSULE | Freq: Every day | ORAL | Status: AC

## 2014-05-23 NOTE — Progress Notes (Signed)
Subjective:    Patient ID: Courtney Tran, female    DOB: 10/27/1965, 49 y.o.   MRN: [id number]  HPI  patient is here today for annual physical. Patient feels well overall. Reviewed chronic medical issues and interval medical events  Past Medical History  Diagnosis Date  . DEPRESSION   . Esophageal reflux   . GALLSTONES   . Obesity, unspecified   . Rosacea   . Esophageal stricture 08/2005    EGD with dilation   . Uterine fibroid   . Dyslipidemia   . Basal cell carcinoma, face 2013    s/p excision   Family History  Problem Relation Age of Onset  . Cirrhosis Mother   . Hyperlipidemia Father   . Hypertension Father   . Diabetes Maternal Aunt   . Colon cancer Paternal Uncle 77  . Colon polyps Brother   . Colon polyps Father   . Diabetes Mellitus I Father 23   History  Substance Use Topics  . Smoking status: Never Smoker   . Smokeless tobacco: Not on file  . Alcohol Use: 0.0 oz/week    0 Standard drinks or equivalent per week     Comment: weekends    Review of Systems  Constitutional: Negative for fatigue and unexpected weight change.  Respiratory: Negative for cough, shortness of breath and wheezing.   Cardiovascular: Negative for chest pain, palpitations and leg swelling.  Gastrointestinal: Negative for nausea, abdominal pain and diarrhea.  Neurological: Negative for dizziness, weakness, light-headedness and headaches.  Psychiatric/Behavioral: Negative for dysphoric mood. The patient is not nervous/anxious.   All other systems reviewed and are negative.      Objective:    Physical Exam  Constitutional: She is oriented to person, place, and time. She appears well-developed and well-nourished. No distress.  obese  HENT:  Head: Normocephalic and atraumatic.  Right Ear: External ear normal.  Left Ear: External ear normal.  Nose: Nose normal.  Mouth/Throat: Oropharynx is clear and moist. No oropharyngeal exudate.  Eyes: EOM are normal. Pupils are equal,  round, and reactive to light. Right eye exhibits no discharge. Left eye exhibits no discharge. No scleral icterus.  Neck: Normal range of motion. Neck supple. No JVD present. No tracheal deviation present. No thyromegaly present.  Cardiovascular: Normal rate, regular rhythm, normal heart sounds and intact distal pulses.  Exam reveals no friction rub.   No murmur heard. Pulmonary/Chest: Effort normal and breath sounds normal. No respiratory distress. She has no wheezes. She has no rales. She exhibits no tenderness.  Abdominal: Soft. Bowel sounds are normal. She exhibits no distension and no mass. There is no tenderness. There is no rebound and no guarding.  Genitourinary:  Defer to gyn  Musculoskeletal: Normal range of motion.  No gross deformities  Lymphadenopathy:    She has no cervical adenopathy.  Neurological: She is alert and oriented to person, place, and time. She has normal reflexes. No cranial nerve deficit.  Skin: Skin is warm and dry. No rash noted. She is not diaphoretic. No erythema.  Psychiatric: She has a normal mood and affect. Her behavior is normal. Judgment and thought content normal.  Nursing note and vitals reviewed.   BP 122/84 mmHg  Pulse 65  Temp(Src) 97.6 F (36.4 C) (Oral)  Ht 5\' 5"  (1.651 m)  Wt 211 lb 8 oz (95.936 kg)  BMI 35.20 kg/m2  SpO2 95%  LMP 03/02/2013 Wt Readings from Last 3 Encounters:  05/23/14 211 lb 8 oz (95.936 kg)  04/03/13  207 lb 1.9 oz (93.949 kg)  03/06/13 212 lb (96.163 kg)     Lab Results  Component Value Date   WBC 14.0* 03/06/2013   HGB 13.8 03/06/2013   HCT 39.5 03/06/2013   PLT 274 03/06/2013   GLUCOSE 103* 03/06/2013   CHOL 223* 11/07/2012   TRIG 120.0 11/07/2012   HDL 38.00* 11/07/2012   LDLDIRECT 175.8 11/07/2012   LDLCALC 147* 09/26/2008   ALT 36* 03/06/2013   AST 25 03/06/2013   NA 138 03/06/2013   K 4.4 03/06/2013   CL 105 03/06/2013   CREATININE 0.84 03/06/2013   BUN 10 03/06/2013   CO2 27 03/06/2013    TSH 0.97 11/07/2012    Mm Screening Breast Tomo Bilateral  11/27/2013   CLINICAL DATA:  Screening.  EXAM: DIGITAL SCREENING BILATERAL MAMMOGRAM WITH 3D TOMO WITH CAD  COMPARISON:  Previous exam(s).  ACR Breast Density Category c: The breast tissue is heterogeneously dense, which may obscure small masses.  FINDINGS: There are no findings suspicious for malignancy. Images were processed with CAD.  IMPRESSION: No mammographic evidence of malignancy. A result letter of this screening mammogram will be mailed directly to the patient.  RECOMMENDATION: Screening mammogram in one year. (Code:SM-B-01Y)  BI-RADS CATEGORY  1: Negative.   Electronically Signed   By: Everlean Alstrom M.D.   On: 11/27/2013 10:20       Assessment & Plan:   CPX/z00.00 - Patient has been counseled on age-appropriate routine health concerns for screening and prevention. These are reviewed and up-to-date. Immunizations are up-to-date or declined. Labs ordered and reviewed.  Problem List Items Addressed This Visit    None    Visit Diagnoses    Routine general medical examination at a health care facility    -  Primary    Relevant Orders    Basic metabolic panel    CBC with Differential/Platelet    Hepatic function panel    Lipid panel    TSH    Urinalysis, Routine w reflex microscopic        Gwendolyn Grant, MD

## 2014-05-23 NOTE — Patient Instructions (Addendum)
It was good to see you today.  We have reviewed your prior records including labs and tests today  Health Maintenance reviewed - all recommended immunizations and age-appropriate screenings are up-to-date.  Test(s) ordered today. Your results will be released to [place] (or called to you) after review, usually within 72hours after test completion. If any changes need to be made, you will be notified at that same time.  Medications reviewed and updated, no changes recommended at this time. Printed for you to mail to your pharmacy as needed  Continue to work on lifestyle changes as discussed (low fat, low carb, increased protein diet; improved exercise efforts; weight loss) to control sugar, blood pressure and cholesterol levels and/or reduce risk of developing other medical problems. Look into [email or web address].[email or web address] or other type of food journal to assist you in this process.  Please schedule followup in 12 months for annual exam and labs, call sooner if problems.  Health Maintenance Adopting a healthy lifestyle and getting preventive care can go a long way to promote health and wellness. Talk with your health care provider about what schedule of regular examinations is right for you. This is a good chance for you to check in with your provider about disease prevention and staying healthy. In between checkups, there are plenty of things you can do on your own. Experts have done a lot of research about which lifestyle changes and preventive measures are most likely to keep you healthy. Ask your health care provider for more information. WEIGHT AND DIET  Eat a healthy diet  Be sure to include plenty of vegetables, fruits, low-fat dairy products, and lean protein.  Do not eat a lot of foods high in solid fats, added sugars, or salt.  Get regular exercise. This is one of the most important things you can do for your health.  Most adults should exercise for at least 150 minutes each week. The exercise  should increase your heart rate and make you sweat (moderate-intensity exercise).  Most adults should also do strengthening exercises at least twice a week. This is in addition to the moderate-intensity exercise.  Maintain a healthy weight  Body mass index (BMI) is a measurement that can be used to identify possible weight problems. It estimates body fat based on height and weight. Your health care provider can help determine your BMI and help you achieve or maintain a healthy weight.  For females 81 years of age and older:   A BMI below 18.5 is considered underweight.  A BMI of 18.5 to 24.9 is normal.  A BMI of 25 to 29.9 is considered overweight.  A BMI of 30 and above is considered obese.  Watch levels of cholesterol and blood lipids  You should start having your blood tested for lipids and cholesterol at 50 years of age, then have this test every 5 years.  You may need to have your cholesterol levels checked more often if:  Your lipid or cholesterol levels are high.  You are older than 50 years of age.  You are at high risk for heart disease.  CANCER SCREENING   Lung Cancer  Lung cancer screening is recommended for adults 10-19 years old who are at high risk for lung cancer because of a history of smoking.  A yearly low-dose CT scan of the lungs is recommended for people who:  Currently smoke.  Have quit within the past 15 years.  Have at least a 30-pack-year history of smoking. A pack year is smoking  an average of one pack of cigarettes a day for 1 year.  Yearly screening should continue until it has been 15 years since you quit.  Yearly screening should stop if you develop a health problem that would prevent you from having lung cancer treatment.  Breast Cancer  Practice breast self-awareness. This means understanding how your breasts normally appear and feel.  It also means doing regular breast self-exams. Let your health care provider know about any  changes, no matter how small.  If you are in your 20s or 30s, you should have a clinical breast exam (CBE) by a health care provider every 1-3 years as part of a regular health exam.  If you are 22 or older, have a CBE every year. Also consider having a breast X-ray (mammogram) every year.  If you have a family history of breast cancer, talk to your health care provider about genetic screening.  If you are at high risk for breast cancer, talk to your health care provider about having an MRI and a mammogram every year.  Breast cancer gene (BRCA) assessment is recommended for women who have family members with BRCA-related cancers. BRCA-related cancers include:  Breast.  Ovarian.  Tubal.  Peritoneal cancers.  Results of the assessment will determine the need for genetic counseling and BRCA1 and BRCA2 testing. Cervical Cancer Routine pelvic examinations to screen for cervical cancer are no longer recommended for nonpregnant women who are considered low risk for cancer of the pelvic organs (ovaries, uterus, and vagina) and who do not have symptoms. A pelvic examination may be necessary if you have symptoms including those associated with pelvic infections. Ask your health care provider if a screening pelvic exam is right for you.   The Pap test is the screening test for cervical cancer for women who are considered at risk.  If you had a hysterectomy for a problem that was not cancer or a condition that could lead to cancer, then you no longer need Pap tests.  If you are older than 65 years, and you have had normal Pap tests for the past 10 years, you no longer need to have Pap tests.  If you have had past treatment for cervical cancer or a condition that could lead to cancer, you need Pap tests and screening for cancer for at least 20 years after your treatment.  If you no longer get a Pap test, assess your risk factors if they change (such as having a new sexual partner). This can affect  whether you should start being screened again.  Some women have medical problems that increase their chance of getting cervical cancer. If this is the case for you, your health care provider may recommend more frequent screening and Pap tests.  The human papillomavirus (HPV) test is another test that may be used for cervical cancer screening. The HPV test looks for the virus that can cause cell changes in the cervix. The cells collected during the Pap test can be tested for HPV.  The HPV test can be used to screen women 33 years of age and older. Getting tested for HPV can extend the interval between normal Pap tests from three to five years.  An HPV test also should be used to screen women of any age who have unclear Pap test results.  After 50 years of age, women should have HPV testing as often as Pap tests.  Colorectal Cancer  This type of cancer can be detected and often prevented.  Routine colorectal cancer screening usually begins at 50 years of age and continues through 50 years of age.  Your health care provider may recommend screening at an earlier age if you have risk factors for colon cancer.  Your health care provider may also recommend using home test kits to check for hidden blood in the stool.  A small camera at the end of a tube can be used to examine your colon directly (sigmoidoscopy or colonoscopy). This is done to check for the earliest forms of colorectal cancer.  Routine screening usually begins at age 35.  Direct examination of the colon should be repeated every 5-10 years through 50 years of age. However, you may need to be screened more often if early forms of precancerous polyps or small growths are found. Skin Cancer  Check your skin from head to toe regularly.  Tell your health care provider about any new moles or changes in moles, especially if there is a change in a mole's shape or color.  Also tell your health care provider if you have a mole that is  larger than the size of a pencil eraser.  Always use sunscreen. Apply sunscreen liberally and repeatedly throughout the day.  Protect yourself by wearing long sleeves, pants, a wide-brimmed hat, and sunglasses whenever you are outside. HEART DISEASE, DIABETES, AND HIGH BLOOD PRESSURE   Have your blood pressure checked at least every 1-2 years. High blood pressure causes heart disease and increases the risk of stroke.  If you are between 23 years and 43 years old, ask your health care provider if you should take aspirin to prevent strokes.  Have regular diabetes screenings. This involves taking a blood sample to check your fasting blood sugar level.  If you are at a normal weight and have a low risk for diabetes, have this test once every three years after 50 years of age.  If you are overweight and have a high risk for diabetes, consider being tested at a younger age or more often. PREVENTING INFECTION  Hepatitis B  If you have a higher risk for hepatitis B, you should be screened for this virus. You are considered at high risk for hepatitis B if:  You were born in a country where hepatitis B is common. Ask your health care provider which countries are considered high risk.  Your parents were born in a high-risk country, and you have not been immunized against hepatitis B (hepatitis B vaccine).  You have HIV or AIDS.  You use needles to inject street drugs.  You live with someone who has hepatitis B.  You have had sex with someone who has hepatitis B.  You get hemodialysis treatment.  You take certain medicines for conditions, including cancer, organ transplantation, and autoimmune conditions. Hepatitis C  Blood testing is recommended for:  Everyone born from [birth year] through 1966.  Anyone with known risk factors for hepatitis C. Sexually transmitted infections (STIs)  You should be screened for sexually transmitted infections (STIs) including gonorrhea and chlamydia  if:  You are sexually active and are younger than 50 years of age.  You are older than 50 years of age and your health care provider tells you that you are at risk for this type of infection.  Your sexual activity has changed since you were last screened and you are at an increased risk for chlamydia or gonorrhea. Ask your health care provider if you are at risk.  If you do not have HIV, but are  at risk, it may be recommended that you take a prescription medicine daily to prevent HIV infection. This is called pre-exposure prophylaxis (PrEP). You are considered at risk if:  You are sexually active and do not regularly use condoms or know the HIV status of your partner(s).  You take drugs by injection.  You are sexually active with a partner who has HIV. Talk with your health care provider about whether you are at high risk of being infected with HIV. If you choose to begin PrEP, you should first be tested for HIV. You should then be tested every 3 months for as long as you are taking PrEP.  PREGNANCY   If you are premenopausal and you may become pregnant, ask your health care provider about preconception counseling.  If you may become pregnant, take 400 to 800 micrograms (mcg) of folic acid every day.  If you want to prevent pregnancy, talk to your health care provider about birth control (contraception). OSTEOPOROSIS AND MENOPAUSE   Osteoporosis is a disease in which the bones lose minerals and strength with aging. This can result in serious bone fractures. Your risk for osteoporosis can be identified using a bone density scan.  If you are 52 years of age or older, or if you are at risk for osteoporosis and fractures, ask your health care provider if you should be screened.  Ask your health care provider whether you should take a calcium or vitamin D supplement to lower your risk for osteoporosis.  Menopause may have certain physical symptoms and risks.  Hormone replacement therapy  may reduce some of these symptoms and risks. Talk to your health care provider about whether hormone replacement therapy is right for you.  HOME CARE INSTRUCTIONS   Schedule regular health, dental, and eye exams.  Stay current with your immunizations.   Do not use any tobacco products including cigarettes, chewing tobacco, or electronic cigarettes.  If you are pregnant, do not drink alcohol.  If you are breastfeeding, limit how much and how often you drink alcohol.  Limit alcohol intake to no more than 1 drink per day for nonpregnant women. One drink equals 12 ounces of beer, 5 ounces of wine, or 1 ounces of hard liquor.  Do not use street drugs.  Do not share needles.  Ask your health care provider for help if you need support or information about quitting drugs.  Tell your health care provider if you often feel depressed.  Tell your health care provider if you have ever been abused or do not feel safe at home. Document Released: 09/15/2010 Document Revised: 07/17/2013 Document Reviewed: 02/01/2013 [facility] Patient Information 2015 [place], [place]. This information is not intended to replace advice given to you by your health care provider. Make sure you discuss any questions you have with your health care provider.

## 2014-05-23 NOTE — Progress Notes (Signed)
Pre visit review using our clinic review tool, if applicable. No additional management support is needed unless otherwise documented below in the visit note. 

## 2014-05-31 ENCOUNTER — Other Ambulatory Visit (INDEPENDENT_AMBULATORY_CARE_PROVIDER_SITE_OTHER): Payer: BLUE CROSS/BLUE SHIELD

## 2014-05-31 DIAGNOSIS — Z0000 Encounter for general adult medical examination without abnormal findings: Secondary | ICD-10-CM

## 2014-05-31 LAB — BASIC METABOLIC PANEL
BUN: 19 mg/dL (ref 6–23)
CALCIUM: 9.3 mg/dL (ref 8.4–10.5)
CHLORIDE: 105 meq/L (ref 96–112)
CO2: 28 mEq/L (ref 19–32)
Creatinine, Ser: 0.9 mg/dL (ref 0.40–1.20)
GFR: 70.58 mL/min (ref >60.00)
Glucose, Bld: 87 mg/dL (ref 70–99)
Potassium: 3.8 mEq/L (ref 3.5–5.1)
SODIUM: 139 meq/L (ref 135–145)

## 2014-05-31 LAB — CBC WITH DIFFERENTIAL/PLATELET
BASOS PCT: 0.7 % (ref 0.0–3.0)
Basophils Absolute: 0.1 10*3/uL (ref 0.0–0.1)
EOS ABS: 0.4 10*3/uL (ref 0.0–0.7)
Eosinophils Relative: 4.3 % (ref 0.0–5.0)
HCT: 45.1 % (ref 36.0–46.0)
Hemoglobin: 15.3 g/dL — ABNORMAL HIGH (ref 12.0–15.0)
LYMPHS ABS: 3.6 10*3/uL (ref 0.7–4.0)
Lymphocytes Relative: 40.1 % (ref 12.0–46.0)
MCHC: 34 g/dL (ref 30.0–36.0)
MCV: 93.2 fl (ref 78.0–100.0)
Monocytes Absolute: 0.5 10*3/uL (ref 0.1–1.0)
Monocytes Relative: 5.2 % (ref 3.0–12.0)
NEUTROS ABS: 4.4 10*3/uL (ref 1.4–7.7)
NEUTROS PCT: 49.7 % (ref 43.0–77.0)
PLATELETS: 281 10*3/uL (ref 150.0–400.0)
RBC: 4.84 Mil/uL (ref 3.87–5.11)
RDW: 13.3 % (ref 11.5–15.5)
WBC: 8.9 10*3/uL (ref 4.0–10.5)

## 2014-05-31 LAB — HEPATIC FUNCTION PANEL
ALT: 33 U/L (ref 0–35)
AST: 19 U/L (ref 0–37)
Albumin: 4.1 g/dL (ref 3.5–5.2)
Alkaline Phosphatase: 66 U/L (ref 39–117)
Bilirubin, Direct: 0.1 mg/dL (ref 0.0–0.3)
Total Bilirubin: 0.3 mg/dL (ref 0.2–1.2)
Total Protein: 6.8 g/dL (ref 6.0–8.3)

## 2014-05-31 LAB — URINALYSIS, ROUTINE W REFLEX MICROSCOPIC
Bilirubin Urine: NEGATIVE
Hgb urine dipstick: NEGATIVE
Ketones, ur: NEGATIVE
Leukocytes, UA: NEGATIVE
NITRITE: NEGATIVE
PH: 6 (ref 5.0–8.0)
RBC / HPF: NONE SEEN (ref 0–?)
Specific Gravity, Urine: >=1.03 — AB (ref 1.000–1.030)
Total Protein, Urine: NEGATIVE
Urine Glucose: NEGATIVE
Urobilinogen, UA: 0.2 (ref 0.0–1.0)

## 2014-05-31 LAB — LIPID PANEL
Cholesterol: 253 mg/dL — ABNORMAL HIGH (ref 0–200)
HDL: 43.1 mg/dL (ref >39.00)
LDL CALC: 178 mg/dL — AB (ref 0–99)
NonHDL: 209.9
TRIGLYCERIDES: 159 mg/dL — AB (ref 0.0–149.0)
Total CHOL/HDL Ratio: 6
VLDL: 31.8 mg/dL (ref 0.0–40.0)

## 2014-05-31 LAB — TSH: TSH: 1.85 u[IU]/mL (ref 0.35–4.50)

## 2014-07-08 NOTE — Op Note (Signed)
PATIENT NAME:  Courtney Tran, Courtney Tran MR#:  [id number] DATE OF BIRTH:  1965-10-14  DATE OF PROCEDURE:  05/07/2011  PREOPERATIVE DIAGNOSES: Nadeen Landau.  1. Left superior ear defect (2.5 x 2.8 cm).  2. Morpheaform basal cell carcinoma status post Mohs resection.   POSTOPERATIVE DIAGNOSES:  1. Left superior ear defect (2.5 x 2.8 cm).  2. Morpheaform basal cell carcinoma status post Mohs resection.   PROCEDURES:  1. Composite graft left ear from concha cavum.  2. Left ear pedicled myocutaneous flap (based on the postauricular vascular pedicle).   SURGEON: Janalee Dane, MD  DESCRIPTION OF PROCEDURE: The patient was placed in supine position on the Operating Room table. After general endotracheal anesthesia had been induced, the patient was turned 90 degrees clockwise from anesthesia, placed in a right head turned position and the ear was locally anesthetized, prepped and draped in the usual fashion. A #15 blade was used to make an incision around the templated tissue as indicated by the defect. The graft was then taken and inset into the defect. After then removal of the graft the vertical dimension was maintained by maintaining the superior antihelical fold, the inferior antihelical fold as well as the scaphoid fossa cartilage was removed meticulously to give a vascular bed for the graft. The graft was pie crusted with an 11 blade and inset with 6-0 and 5-0 nylon sutures. The defect left by the graft harvest was then reconstructed with removal of the cartilage and then a fusiform incision of postauricular skin pedicled on the postauricular vascular pedicle the flap was then pulled through the defect and the postauricular skin was closed with a 5-0 fast absorbing gut suture after her 4-0 Vicryl was used to reinforce the flap and meticulous hemostasis was achieved. The flap skin was then inset with 6-0 nylon and 5-0 nylon sutures. The wounds were then dressed with bacitracin. Patient was returned to  anesthesia, allowed to emerge from anesthesia in the Operating Room, taken to recovery room in stable condition. There were no complications. Estimated blood loss 25 mL.  ____________________________ J. Nadeen Landau, MD jmc:cms D: 05/07/2011 20:46:52 ET T: 05/08/2011 07:22:25 ET  JOB#: [id number] Nicholos Johns MD ELECTRONICALLY SIGNED 05/18/2011 10:03

## 2014-11-13 ENCOUNTER — Encounter: Payer: Self-pay | Admitting: Internal Medicine

## 2014-12-05 ENCOUNTER — Encounter: Payer: Self-pay | Admitting: Family Medicine

## 2014-12-05 ENCOUNTER — Ambulatory Visit (INDEPENDENT_AMBULATORY_CARE_PROVIDER_SITE_OTHER): Payer: BLUE CROSS/BLUE SHIELD | Admitting: Family Medicine

## 2014-12-05 ENCOUNTER — Other Ambulatory Visit (INDEPENDENT_AMBULATORY_CARE_PROVIDER_SITE_OTHER): Payer: BLUE CROSS/BLUE SHIELD

## 2014-12-05 VITALS — BP 122/82 | HR 83 | Ht 66.0 in | Wt 207.0 lb

## 2014-12-05 DIAGNOSIS — M7671 Peroneal tendinitis, right leg: Secondary | ICD-10-CM | POA: Diagnosis not present

## 2014-12-05 DIAGNOSIS — M79671 Pain in right foot: Secondary | ICD-10-CM | POA: Diagnosis not present

## 2014-12-05 DIAGNOSIS — M7670 Peroneal tendinitis, unspecified leg: Secondary | ICD-10-CM | POA: Insufficient documentation

## 2014-12-05 NOTE — Progress Notes (Signed)
[name] [address] [place], [place] [address] Phone: [phone] Subjective:    I'm seeing this patient by the request  of:  Gwendolyn Grant, MD   CC: right ankle pain.   [id number] Courtney Tran is a 49 y.o. female coming in with complaint of right ankle pain. Patient remembers over Memorial Day patient did take a step wrong and had some mild discomfort 2 days later. Since then unfortunately when she does any activity she feels heel pain as well as pain on the lateral ankle. Patient describes the pain as more of a dull aching sensation. Patient states though that it is starting to stop her from some activities. Sometimes has some mild swelling. Denies any numbness or weakness. Denies any giving out on her. Rates the severity of discomfort as 5 out of 10. No nighttime awakening. Has not responded to over-the-counter medications.  Past Medical History  Diagnosis Date  . DEPRESSION   . Esophageal reflux   . GALLSTONES   . Obesity, unspecified   . Rosacea   . Esophageal stricture 08/2005    EGD with dilation   . Uterine fibroid   . Dyslipidemia   . Basal cell carcinoma, face 2013    s/p excision   Past Surgical History  Procedure Laterality Date  . Ruptured bowel repair  1967  . Wisdom tooth extraction    . External ear surgery    . Robotic assisted total hysterectomy N/A 03/06/2013    Procedure: ROBOTIC ASSISTED TOTAL HYSTERECTOMY WITH BILATERAL SALPINGECTOMY;  Surgeon: Princess Bruins, MD;  Location: [facility];  Service: Gynecology;  Laterality: N/A;  . Abdominal hysterectomy  03/06/13   Social History  Substance Use Topics  . Smoking status: Never Smoker   . Smokeless tobacco: None  . Alcohol Use: 0.0 oz/week    0 Standard drinks or equivalent per week     Comment: weekends   Allergies  Allergen Reactions  . Lactose Intolerance (Gi)    Family History  Problem Relation Age of Onset  . Cirrhosis Mother   . Hyperlipidemia  Father   . Hypertension Father   . Diabetes Maternal Aunt   . Colon cancer Paternal Uncle 58  . Colon polyps Brother   . Colon polyps Father   . Diabetes Mellitus I Father 71        Past medical history, social, surgical and family history all reviewed in electronic medical record.   Review of Systems: No headache, visual changes, nausea, vomiting, diarrhea, constipation, dizziness, abdominal pain, skin rash, fevers, chills, night sweats, weight loss, swollen lymph nodes, body aches, joint swelling, muscle aches, chest pain, shortness of breath, mood changes.   Objective Blood pressure 122/82, pulse 83, height 5\' 6"  (1.676 m), weight 207 lb (93.895 kg), last menstrual period 03/02/2013.  General: No apparent distress alert and oriented x3 mood and affect normal, dressed appropriately.  HEENT: Pupils equal, extraocular movements intact  Respiratory: Patient's speak in full sentences and does not appear short of breath  Cardiovascular: No lower extremity edema, non tender, no erythema  Skin: Warm dry intact with no signs of infection or rash on extremities or on axial skeleton.  Abdomen: Soft nontender  Neuro: Cranial nerves II through XII are intact, neurovascularly intact in all extremities with 2+ DTRs and 2+ pulses.  Lymph: No lymphadenopathy of posterior or anterior cervical chain or axillae bilaterally.  Gait normal with good balance and coordination.  MSK:  Non tender with full range  of motion and good stability and symmetric strength and tone of shoulders, elbows, wrist, hip, knees bilaterally.  Ankle:Right ankle Trace swelling on the lateral aspect of the ankle Range of motion is full in all directions. Strength is 5/5 in all directions. Stable lateral and medial ligaments; squeeze test and kleiger test unremarkable; Talar dome nontender; No pain at base of 5th MT; No tenderness over cuboid; No tenderness over N spot or navicular prominence No tenderness on posterior  aspects of lateral and medial malleolus No signs of peroneal subluxation the patient is tender to palpation over the peroneal tendons Negative tarsal tunnel tinel's Able to walk 4 steps. Contralateral ankle unremarkable  MSK US performed of: right ankle This study was ordered, performed, and interpreted by Charlann Boxer D.O.  Foot/Ankle:   All structures visualized.   Talar dome unremarkable  Ankle mortise without effusion. Peroneus longus and brevis tendons show significant hypoechoic changes proximal to the lateral malleolus as well as inferiorly.Marland Kitchen Posterior tibialis, flexor hallucis longus, and flexor digitorum longus tendons unremarkable on long and transverse views without sheath effusions. Achilles tendon visualized along length of tendon and unremarkable on long and transverse views without sheath effusion. Anterior Talofibular Ligament and Calcaneofibular Ligaments unremarkable and intact. Deltoid Ligament unremarkable and intact. Plantar fascia intact and without effusion, normal thickness. No increased doppler signal, cap sign, or thickening of tibial cortex. Power doppler signal normal.  IMPRESSION:  Peroneal tendinitis with questionable rupture of the peroneal brevis  Procedure note 15400; 15 minutes spent for Therapeutic exercises as stated in above notes.  This included exercises focusing on stretching, strengthening, with significant focus on eccentric aspects. Ankle strengthening that included:  Basic range of motion exercises to allow proper full motion at ankle Stretching of the lower leg and hamstrings  Theraband exercises for the lower leg - inversion, eversion, dorsiflexion and plantarflexion each to be completed with a theraband Balance exercises to increase proprioception Weight bearing exercises to increase strength and balance  Proper technique shown and discussed handout in great detail with ATC.  All questions were discussed and answered.       Impression  and Recommendations:     This case required medical decision making of moderate complexity.

## 2014-12-05 NOTE — Assessment & Plan Note (Signed)
Patient does have some inflammation noted. Do think that this is likely which contributed to it. There is a possibility for patient having a tear of the peroneal brevis that is likely chronic this healing 3-6 weeks. We discussed bracing, home exercises, icing protocol, and topical anti-inflammatory's. Patient try to make these different changes and come back and see me again in 3-6 weeks. We also discussed the importance of a heel lift.

## 2014-12-05 NOTE — Progress Notes (Signed)
Pre visit review using our clinic review tool, if applicable. No additional management support is needed unless otherwise documented below in the visit note. 

## 2014-12-05 NOTE — Patient Instructions (Signed)
Good to see you Ice 20 minutes 2 times daily. Usually after activity and before bed. Exercises 3 times a week.  pennsaid pinkie amount topically 2 times daily as needed.  Heel lift in right shoe at all time Wear brace daily for 7-10 days then with exercise for 6 weeks.  Vitamin D 4000 IU daily for 2 weeks then back to 2000 IU daily Turmeric 500mg  twice daily  Ok to do all other activity See me again in 3-4 weeks.

## 2014-12-26 ENCOUNTER — Ambulatory Visit (INDEPENDENT_AMBULATORY_CARE_PROVIDER_SITE_OTHER): Payer: BLUE CROSS/BLUE SHIELD | Admitting: Family Medicine

## 2014-12-26 ENCOUNTER — Encounter: Payer: Self-pay | Admitting: Family Medicine

## 2014-12-26 VITALS — BP 132/84 | HR 87 | Ht 66.0 in | Wt 208.0 lb

## 2014-12-26 DIAGNOSIS — M7671 Peroneal tendinitis, right leg: Secondary | ICD-10-CM | POA: Diagnosis not present

## 2014-12-26 MED ORDER — DICLOFENAC SODIUM 2 % TD SOLN: TRANSDERMAL | Status: DC

## 2014-12-26 NOTE — Assessment & Plan Note (Signed)
Patient is doing remarkably well and is ahead of schedule. We discussed continuing the exercises as well as the icing for the next 6 weeks. Patient will also continue the brace with activity for 6 weeks. We discussed that likely this will take 2-3 weeks until she is completely pain-free. If patient has any setback she will call sooner otherwise she will see me in 6 weeks. Continued have pain we will consider either injection verse possible nitroglycerin.  Spent  25 minutes with patient face-to-face and had greater than 50% of counseling including as described above in assessment and plan.

## 2014-12-26 NOTE — Patient Instructions (Signed)
Good to see you Ice is your friend still Continue the brace with exercises for next 6 weeks.  Pennsaid as you need it.  See me again in 6 weeks if not perfect

## 2014-12-26 NOTE — Progress Notes (Signed)
[name] [address] [place],  [place] [address] Phone: [phone] Subjective:     CC: right ankle pain f/u  SAY:[id number] Courtney Tran is a 49 y.o. female coming in with complaint of right ankle pain. Patient was found to have more of a peroneal tendinitis as well as the potential for peroneal rupture. Patient states that she did have swelling worse for the first 72 hours and then has is significantly improved. Patient states that she is able to do all activities and is working out on a regular basis. Patient states that there is some mild discomfort but seems to go away with the topical anti-inflammatory as well as icing. She is very happy and states that she feels 85% better.  Past Medical History  Diagnosis Date  . DEPRESSION   . Esophageal reflux   . GALLSTONES   . Obesity, unspecified   . Rosacea   . Esophageal stricture 08/2005    EGD with dilation   . Uterine fibroid   . Dyslipidemia   . Basal cell carcinoma, face 2013    s/p excision   Past Surgical History  Procedure Laterality Date  . Ruptured bowel repair  1967  . Wisdom tooth extraction    . External ear surgery    . Robotic assisted total hysterectomy N/A 03/06/2013    Procedure: ROBOTIC ASSISTED TOTAL HYSTERECTOMY WITH BILATERAL SALPINGECTOMY;  Surgeon: Princess Bruins, MD;  Location: [facility];  Service: Gynecology;  Laterality: N/A;  . Abdominal hysterectomy  03/06/13   Social History  Substance Use Topics  . Smoking status: Never Smoker   . Smokeless tobacco: None  . Alcohol Use: 0.0 oz/week    0 Standard drinks or equivalent per week     Comment: weekends   Allergies  Allergen Reactions  . Lactose Intolerance (Gi)    Family History  Problem Relation Age of Onset  . Cirrhosis Mother   . Hyperlipidemia Father   . Hypertension Father   . Diabetes Maternal Aunt   . Colon cancer Paternal Uncle 7  . Colon polyps Brother   . Colon polyps Father   . Diabetes  Mellitus I Father 53        Past medical history, social, surgical and family history all reviewed in electronic medical record.   Review of Systems: No headache, visual changes, nausea, vomiting, diarrhea, constipation, dizziness, abdominal pain, skin rash, fevers, chills, night sweats, weight loss, swollen lymph nodes, body aches, joint swelling, muscle aches, chest pain, shortness of breath, mood changes.   Objective Blood pressure 132/84, pulse 87, height 5\' 6"  (1.676 m), weight 208 lb (94.348 kg), last menstrual period 03/02/2013, SpO2 97 %.  General: No apparent distress alert and oriented x3 mood and affect normal, dressed appropriately.  HEENT: Pupils equal, extraocular movements intact  Respiratory: Patient's speak in full sentences and does not appear short of breath  Cardiovascular: No lower extremity edema, non tender, no erythema  Skin: Warm dry intact with no signs of infection or rash on extremities or on axial skeleton.  Abdomen: Soft nontender  Neuro: Cranial nerves II through XII are intact, neurovascularly intact in all extremities with 2+ DTRs and 2+ pulses.  Lymph: No lymphadenopathy of posterior or anterior cervical chain or axillae bilaterally.  Gait normal with good balance and coordination.  MSK:  Non tender with full range of motion and good stability and symmetric strength and tone of shoulders, elbows, wrist, hip, knees bilaterally.  Ankle:Right ankle Minimal swelling  on the lateral aspect of the ankle Range of motion is full in all directions. Strength is 5/5 in all directions. Stable lateral and medial ligaments; squeeze test and kleiger test unremarkable; Talar dome nontender; No pain at base of 5th MT; No tenderness over cuboid; No tenderness over N spot or navicular prominence No tenderness on posterior aspects of lateral and medial malleolus Patient nontender over the peroneal tendons Negative tarsal tunnel tinel's Able to walk 4  steps. Contralateral ankle unremarkable  MSK US performed of: right ankle This study was ordered, performed, and interpreted by Charlann Boxer D.O.  Foot/Ankle:   All structures visualized.   Talar dome unremarkable  Ankle mortise without effusion.  Can decrease in hypoechoic changes within the tendon sheath. Patient's peroneal brevis is now able to be seen still has some mild retraction but improved from previous exam. Posterior tibialis, flexor hallucis longus, and flexor digitorum longus tendons unremarkable on long and transverse views without sheath effusions. Achilles tendon visualized along length of tendon and unremarkable on long and transverse views without sheath effusion. Anterior Talofibular Ligament and Calcaneofibular Ligaments unremarkable and intact. Deltoid Ligament unremarkable and intact. Plantar fascia intact and without effusion, normal thickness. No increased doppler signal, cap sign, or thickening of tibial cortex. Power doppler signal normal.  IMPRESSION:  Significant decrease in hypoechoic changes that was seen previously with healing of the peroneal brevis rupture.       Impression and Recommendations:     This case required medical decision making of moderate complexity.

## 2014-12-26 NOTE — Progress Notes (Signed)
Pre visit review using our clinic review tool, if applicable. No additional management support is needed unless otherwise documented below in the visit note. 

## 2015-02-04 ENCOUNTER — Encounter: Payer: Self-pay | Admitting: Internal Medicine

## 2015-02-04 MED ORDER — DOXYCYCLINE HYCLATE 100 MG PO TABS: 100.0000 mg | ORAL_TABLET | Freq: Two times a day (BID) | ORAL | Status: DC

## 2015-02-04 MED ORDER — DESVENLAFAXINE SUCCINATE ER 50 MG PO TB24: 50.0000 mg | ORAL_TABLET | Freq: Every day | ORAL | Status: DC

## 2015-02-11 ENCOUNTER — Ambulatory Visit: Payer: BLUE CROSS/BLUE SHIELD | Admitting: Family Medicine

## 2015-05-31 ENCOUNTER — Telehealth: Payer: Self-pay | Admitting: Internal Medicine

## 2015-05-31 MED ORDER — DESVENLAFAXINE SUCCINATE ER 50 MG PO TB24: 50.0000 mg | ORAL_TABLET | Freq: Every day | ORAL | Status: DC

## 2015-05-31 NOTE — Telephone Encounter (Signed)
Pt request refill for desvenlafaxine (PRISTIQ) 50 MG 24 hr tablet to be send to [facility]. Pt has an appt with Dr. Quay Burow on 08/23/15.

## 2015-05-31 NOTE — Telephone Encounter (Signed)
Sent refill to [facility].../lmb 

## 2015-08-23 ENCOUNTER — Encounter: Payer: Self-pay | Admitting: Internal Medicine

## 2015-08-23 ENCOUNTER — Ambulatory Visit (INDEPENDENT_AMBULATORY_CARE_PROVIDER_SITE_OTHER): Payer: 59 | Admitting: Internal Medicine

## 2015-08-23 VITALS — BP 128/72 | HR 88 | Temp 98.4°F | Resp 16 | Ht 66.0 in | Wt 207.0 lb

## 2015-08-23 DIAGNOSIS — Z1211 Encounter for screening for malignant neoplasm of colon: Secondary | ICD-10-CM

## 2015-08-23 DIAGNOSIS — K219 Gastro-esophageal reflux disease without esophagitis: Secondary | ICD-10-CM

## 2015-08-23 DIAGNOSIS — L719 Rosacea, unspecified: Secondary | ICD-10-CM

## 2015-08-23 DIAGNOSIS — F419 Anxiety disorder, unspecified: Secondary | ICD-10-CM

## 2015-08-23 DIAGNOSIS — E785 Hyperlipidemia, unspecified: Secondary | ICD-10-CM | POA: Diagnosis not present

## 2015-08-23 DIAGNOSIS — E669 Obesity, unspecified: Secondary | ICD-10-CM

## 2015-08-23 DIAGNOSIS — M67439 Ganglion, unspecified wrist: Secondary | ICD-10-CM | POA: Insufficient documentation

## 2015-08-23 DIAGNOSIS — K222 Esophageal obstruction: Secondary | ICD-10-CM

## 2015-08-23 DIAGNOSIS — F329 Major depressive disorder, single episode, unspecified: Secondary | ICD-10-CM

## 2015-08-23 DIAGNOSIS — M67431 Ganglion, right wrist: Secondary | ICD-10-CM

## 2015-08-23 DIAGNOSIS — F32A Depression, unspecified: Secondary | ICD-10-CM

## 2015-08-23 MED ORDER — DESVENLAFAXINE SUCCINATE ER 50 MG PO TB24: 50.0000 mg | ORAL_TABLET | Freq: Every day | ORAL | Status: DC

## 2015-08-23 MED ORDER — NALTREXONE-BUPROPION HCL ER 8-90 MG PO TB12: ORAL_TABLET | ORAL | Status: DC

## 2015-08-23 MED ORDER — DOXYCYCLINE HYCLATE 100 MG PO TABS: 100.0000 mg | ORAL_TABLET | Freq: Every day | ORAL | Status: DC

## 2015-08-23 NOTE — Assessment & Plan Note (Signed)
GERD controlled Continue daily medication  

## 2015-08-23 NOTE — Assessment & Plan Note (Signed)
Will monitor for now If discomfort or it bothers her will refer to hand surgery

## 2015-08-23 NOTE — Progress Notes (Signed)
Pre visit review using our clinic review tool, if applicable. No additional management support is needed unless otherwise documented below in the visit note. 

## 2015-08-23 NOTE — Assessment & Plan Note (Signed)
Stressed exercise - needs to increase  Decrease calorie intake - improve foods she eats Will try contrave If not effective or too expensive can try phentermine F/u in one month, sooner if needed

## 2015-08-23 NOTE — Assessment & Plan Note (Signed)
Controlled, stable Continue current dose of medication  

## 2015-08-23 NOTE — Assessment & Plan Note (Signed)
Controlled with doxycycline Continue once daily

## 2015-08-23 NOTE — Assessment & Plan Note (Signed)
occ dysphagia with rice Will discuss with GI

## 2015-08-23 NOTE — Progress Notes (Signed)
Subjective:    Patient ID: Courtney Tran, female    DOB: Mar 07, 1966, 50 y.o.   MRN: [id number]  HPI She is here to establish with a new pcp.  She is here for follow up.  Depression, anxiety: She is taking her medication daily as prescribed. She denies any side effects from the medication. She feels her depression and anxiety are well controlled and she is happy with her current dose of medication.   GERD, esophageal stricture:  She is taking her medication daily as prescribed.  She denies any GERD symptoms and feels her GERD is well controlled.  She has a flare about once a month which is related to what she eats.  She sometimes experiences difficulty swallowing but it depending on what she eats - rice.   Rosacea:  She is taking doxycycline once daily.  Her rosacea is better with the medication.    Ganglion cyst right wrist:  She had a bump show up recently on her anterior right wrist - it is non-tender.  Obesity:  She eats more fast food.  She walks her dogs two miles a day with her dogs. She knows what she needs to do but would like some help. She has tried belviq in the past and it did not work.  She is interested in [place].   Medications and allergies reviewed with patient and updated if appropriate.  Patient Active Problem List   Diagnosis Date Noted  . Peroneal tendinitis 12/05/2014  . Dyslipidemia   . Anxiety 10/05/2011  . OBESITY, UNSPECIFIED 08/21/2008  . Depression 08/21/2008  . ROSACEA 08/21/2008  . GALLSTONES 05/04/2008  . ESOPHAGEAL STRICTURE 03/14/2007  . Esophageal reflux 03/14/2007    Current Outpatient Prescriptions on File Prior to Visit  Medication Sig Dispense Refill  . B Complex-C (B-COMPLEX WITH VITAMIN C) tablet Take 1 tablet by mouth daily.    . cetirizine (ZYRTEC) 10 MG tablet Take 10 mg by mouth daily.      . cholecalciferol (VITAMIN D) 1000 UNITS tablet Take 1,000 Units by mouth daily.    . cyanocobalamin 100 MCG tablet Take 100 mcg by mouth  daily.    Marland Kitchen desvenlafaxine (PRISTIQ) 50 MG 24 hr tablet Take 1 tablet (50 mg total) by mouth daily. Must keep appt w/new PCP in June for refills 90 tablet 0  . Diclofenac Sodium 2 % SOLN Apply 1 pump twice daily. 112 g 3  . doxycycline (VIBRA-TABS) 100 MG tablet Take 1 tablet (100 mg total) by mouth 2 (two) times daily. 60 tablet 5  . esomeprazole (NEXIUM) 40 MG capsule Take 1 capsule (40 mg total) by mouth daily before breakfast. 30 capsule 5  . Prenatal Vit-Fe Fumarate-FA (PRENATAL MULTIVITAMIN) TABS tablet Take 1 tablet by mouth daily at 12 noon.    . Thiamine HCl (VITAMIN B-1) 250 MG tablet Take 250 mg by mouth daily.    Marland Kitchen tretinoin (RETIN-A) 0.1 % cream Apply topically at bedtime.      . vitamin E 100 UNIT capsule Take 100 Units by mouth.     No current facility-administered medications on file prior to visit.    Past Medical History  Diagnosis Date  . DEPRESSION   . Esophageal reflux   . GALLSTONES   . Obesity, unspecified   . Rosacea   . Esophageal stricture 08/2005    EGD with dilation   . Uterine fibroid   . Dyslipidemia   . Basal cell carcinoma, face 2013    s/p  excision    Past Surgical History  Procedure Laterality Date  . Ruptured bowel repair  1967  . Wisdom tooth extraction    . External ear surgery    . Robotic assisted total hysterectomy N/A 03/06/2013    Procedure: ROBOTIC ASSISTED TOTAL HYSTERECTOMY WITH BILATERAL SALPINGECTOMY;  Surgeon: Princess Bruins, MD;  Location: [facility];  Service: Gynecology;  Laterality: N/A;  . Abdominal hysterectomy  03/06/13    Social History   Social History  . Marital Status: Married    Spouse Name: N/A  . Number of Children: N/A  . Years of Education: N/A   Social History Main Topics  . Smoking status: Never Smoker   . Smokeless tobacco: None  . Alcohol Use: 0.0 oz/week    0 Standard drinks or equivalent per week     Comment: weekends  . Drug Use: No  . Sexual Activity: Not Asked   Other Topics Concern  . None     Social History Narrative    Family History  Problem Relation Age of Onset  . Cirrhosis Mother   . Hyperlipidemia Father   . Hypertension Father   . Diabetes Maternal Aunt   . Colon cancer Paternal Uncle 22  . Colon polyps Brother   . Colon polyps Father   . Diabetes Mellitus I Father 51    Review of Systems  Constitutional: Negative for fever and chills.  HENT: Positive for trouble swallowing (occasionally with rice).   Respiratory: Negative for cough, shortness of breath and wheezing.   Cardiovascular: Negative for chest pain, palpitations and leg swelling.  Gastrointestinal: Negative for nausea and abdominal pain.  Neurological: Positive for headaches (rare). Negative for dizziness and light-headedness.       Objective:   Filed Vitals:   08/23/15 0759  BP: 128/72  Pulse: 88  Temp: 98.4 F (36.9 C)  Resp: 16   Filed Weights   08/23/15 0759  Weight: 207 lb (93.895 kg)   Body mass index is 33.43 kg/(m^2).   Physical Exam Constitutional: Appears well-developed and well-nourished. No distress.  Neck: Neck supple. No tracheal deviation present. No thyromegaly present.  No carotid bruit. No cervical adenopathy.   Cardiovascular: Normal rate, regular rhythm and normal heart sounds.   No murmur heard.  No edema Pulmonary/Chest: Effort normal and breath sounds normal. No respiratory distress. No wheezes.       Assessment & Plan:   See Problem List for Assessment and Plan of chronic medical problems.  F/u in one month

## 2015-08-23 NOTE — Assessment & Plan Note (Signed)
Will hold off on rechecking it  Work on weight loss, exercise

## 2015-08-23 NOTE — Patient Instructions (Addendum)
   All other Health Maintenance issues reviewed.   All recommended immunizations and age-appropriate screenings are up-to-date or discussed.   Medications reviewed and updated.  Changes include trying contrave for weight loss.  Your prescription(s) have been submitted to your pharmacy. Please take as directed and contact our office if you believe you are having problem(s) with the medication(s).  A referral was ordered for GI for your colonoscopy.   Please followup in 4 weeks

## 2015-08-30 ENCOUNTER — Encounter: Payer: Self-pay | Admitting: Internal Medicine

## 2015-09-02 ENCOUNTER — Telehealth: Payer: Self-pay | Admitting: Emergency Medicine

## 2015-09-02 ENCOUNTER — Telehealth: Payer: Self-pay | Admitting: Internal Medicine

## 2015-09-02 MED ORDER — DESVENLAFAXINE SUCCINATE ER 50 MG PO TB24: 50.0000 mg | ORAL_TABLET | Freq: Every day | ORAL | Status: DC

## 2015-09-02 NOTE — Telephone Encounter (Signed)
Please advise on Contrave medication.

## 2015-09-02 NOTE — Telephone Encounter (Signed)
Pt requests 30 day of Pristiq be sent to local [facility]. Recent 90 day to mail order.

## 2015-09-03 NOTE — Telephone Encounter (Signed)
Left message for patient to return my call.

## 2015-09-03 NOTE — Telephone Encounter (Signed)
Okay with me 

## 2015-09-20 ENCOUNTER — Ambulatory Visit (INDEPENDENT_AMBULATORY_CARE_PROVIDER_SITE_OTHER): Payer: 59 | Admitting: Internal Medicine

## 2015-09-20 ENCOUNTER — Encounter: Payer: Self-pay | Admitting: Internal Medicine

## 2015-09-20 VITALS — BP 130/88 | HR 69 | Temp 98.1°F | Resp 16 | Ht 66.0 in | Wt 205.0 lb

## 2015-09-20 DIAGNOSIS — E669 Obesity, unspecified: Secondary | ICD-10-CM

## 2015-09-20 NOTE — Progress Notes (Signed)
Subjective:    Patient ID: Courtney Tran, female    DOB: [birth year], 50 y.o.   MRN: [id number]  HPI She is here for follow up.  Obesity:  We started her on contrave one month ago and she is here for follow up.  She has experienced dry mouth as a side effect.  It does decrease her appetite and she felt that taking two pills a day decreased her appetite too much.  She is only taking the medication one pill once a day.   She is walking her dogs 2 miles a day.  She has made changes in what she eats and is eating more healthy - she no longer craves bad food.  According to our scale she has lost two pounds and she is happy about that.    Medications and allergies reviewed with patient and updated if appropriate.  Patient Active Problem List   Diagnosis Date Noted  . Ganglion cyst of wrist 08/23/2015  . Peroneal tendinitis 12/05/2014  . Dyslipidemia   . Anxiety 10/05/2011  . OBESITY, UNSPECIFIED 08/21/2008  . Depression 08/21/2008  . ROSACEA 08/21/2008  . GALLSTONES 05/04/2008  . ESOPHAGEAL STRICTURE 03/14/2007  . Esophageal reflux 03/14/2007    Current Outpatient Prescriptions on File Prior to Visit  Medication Sig Dispense Refill  . B Complex-C (B-COMPLEX WITH VITAMIN C) tablet Take 1 tablet by mouth daily.    . cetirizine (ZYRTEC) 10 MG tablet Take 10 mg by mouth daily.      . cholecalciferol (VITAMIN D) 1000 UNITS tablet Take 1,000 Units by mouth daily.    . cyanocobalamin 100 MCG tablet Take 100 mcg by mouth daily.    Marland Kitchen desvenlafaxine (PRISTIQ) 50 MG 24 hr tablet Take 1 tablet (50 mg total) by mouth daily. 90 tablet 2  . Diclofenac Sodium 2 % SOLN Apply 1 pump twice daily. 112 g 3  . doxycycline (VIBRA-TABS) 100 MG tablet Take 1 tablet (100 mg total) by mouth daily. 90 tablet 3  . esomeprazole (NEXIUM) 40 MG capsule Take 1 capsule (40 mg total) by mouth daily before breakfast. 30 capsule 5  . Naltrexone-Bupropion HCl ER (CONTRAVE) 8-90 MG TB12 1 tab PO qam x1wk, then 1 tab PO  bid x1wk, then 2 tabs PO qam and 1 tab PO qpm x1wk; then take 2 tabs PO bid 120 tablet 3  . PAZEO 0.7 % SOLN   0  . Prenatal Vit-Fe Fumarate-FA (PRENATAL MULTIVITAMIN) TABS tablet Take 1 tablet by mouth daily at 12 noon.    . Thiamine HCl (VITAMIN B-1) 250 MG tablet Take 250 mg by mouth daily.    Marland Kitchen tretinoin (RETIN-A) 0.1 % cream Apply topically at bedtime.      . vitamin E 100 UNIT capsule Take 100 Units by mouth.     No current facility-administered medications on file prior to visit.    Past Medical History  Diagnosis Date  . DEPRESSION   . Esophageal reflux   . GALLSTONES   . Obesity, unspecified   . Rosacea   . Esophageal stricture 08/2005    EGD with dilation   . Uterine fibroid   . Dyslipidemia   . Basal cell carcinoma, face 2013    s/p excision    Past Surgical History  Procedure Laterality Date  . Ruptured bowel repair  1967  . Wisdom tooth extraction    . External ear surgery    . Robotic assisted total hysterectomy N/A 03/06/2013    Procedure: ROBOTIC  ASSISTED TOTAL HYSTERECTOMY WITH BILATERAL SALPINGECTOMY;  Surgeon: Princess Bruins, MD;  Location: [facility];  Service: Gynecology;  Laterality: N/A;  . Abdominal hysterectomy  03/06/13    Social History   Social History  . Marital Status: Married    Spouse Name: N/A  . Number of Children: N/A  . Years of Education: N/A   Social History Main Topics  . Smoking status: Never Smoker   . Smokeless tobacco: Not on file  . Alcohol Use: 0.0 oz/week    0 Standard drinks or equivalent per week     Comment: weekends  . Drug Use: No  . Sexual Activity: Not on file   Other Topics Concern  . Not on file   Social History Narrative    Family History  Problem Relation Age of Onset  . Cirrhosis Mother   . Hyperlipidemia Father   . Hypertension Father   . Diabetes Maternal Aunt   . Colon cancer Paternal Uncle 46  . Colon polyps Brother   . Colon polyps Father   . Diabetes Mellitus I Father 22    Review of  Systems  Constitutional: Positive for appetite change (decreased).  HENT:       Dry mouth  Cardiovascular: Negative for chest pain and palpitations.  Gastrointestinal: Negative for nausea and abdominal pain.  Neurological: Negative for dizziness, light-headedness and headaches.  Psychiatric/Behavioral: Negative for sleep disturbance.       Objective:   Filed Vitals:   09/20/15 0839  BP: 130/88  Pulse: 69  Temp: 98.1 F (36.7 C)  Resp: 16   Filed Weights   09/20/15 0839  Weight: 205 lb (92.987 kg)   Body mass index is 33.1 kg/(m^2).   Physical Exam Constitutional: Appears well-developed and well-nourished. No distress.  Cardiovascular: Normal rate, regular rhythm and normal heart sounds.   No murmur heard.  No edema Pulmonary/Chest: Effort normal and breath sounds normal. No respiratory distress. No wheezes.  Psych: normal mood and affect       Assessment & Plan:   See Problem List for Assessment and Plan of chronic medical problems.  F/u in 6 months

## 2015-09-20 NOTE — Progress Notes (Signed)
Pre visit review using our clinic review tool, if applicable. No additional management support is needed unless otherwise documented below in the visit note. 

## 2015-09-20 NOTE — Assessment & Plan Note (Addendum)
Wt Readings from Last 3 Encounters:  09/20/15 205 lb (92.987 kg)  08/23/15 207 lb (93.895 kg)  12/26/14 208 lb (94.348 kg)   Tolerating contrave except dry mouth - likes taking just one pill daily - feels it is helping, but not decreasing her appetite too much (concern for rebound weight gain after stopping medication )-  She wants to work on weight loss as naturally as possible Continue one tab daily - can increase if needed Continue lifestyle changes - decreased portions, healthier options and regular exercise

## 2015-09-20 NOTE — Patient Instructions (Signed)
  Medications reviewed and updated.  No changes recommended at this time.     Please followup in 6 months   

## 2015-09-29 ENCOUNTER — Other Ambulatory Visit: Payer: Self-pay | Admitting: Internal Medicine

## 2015-10-03 ENCOUNTER — Encounter: Payer: Self-pay | Admitting: Internal Medicine

## 2015-11-25 ENCOUNTER — Ambulatory Visit (AMBULATORY_SURGERY_CENTER): Payer: Self-pay | Admitting: *Deleted

## 2015-11-25 VITALS — Ht 66.0 in | Wt 210.0 lb

## 2015-11-25 DIAGNOSIS — Z1211 Encounter for screening for malignant neoplasm of colon: Secondary | ICD-10-CM

## 2015-11-25 MED ORDER — NA SULFATE-K SULFATE-MG SULF 17.5-3.13-1.6 GM/177ML PO SOLN: 1.0000 | Freq: Once | ORAL | 0 refills | Status: AC

## 2015-11-25 NOTE — Progress Notes (Signed)
No egg or soy allergy. No anesthesia problems.  No home O2.  No diet meds.  

## 2015-11-26 ENCOUNTER — Encounter: Payer: Self-pay | Admitting: Internal Medicine

## 2015-12-09 ENCOUNTER — Encounter: Payer: Self-pay | Admitting: Internal Medicine

## 2015-12-09 ENCOUNTER — Ambulatory Visit (AMBULATORY_SURGERY_CENTER): Payer: 59 | Admitting: Internal Medicine

## 2015-12-09 VITALS — BP 132/77 | HR 74 | Temp 99.1°F | Resp 12 | Ht 66.0 in | Wt 210.0 lb

## 2015-12-09 DIAGNOSIS — Z1211 Encounter for screening for malignant neoplasm of colon: Secondary | ICD-10-CM | POA: Diagnosis present

## 2015-12-09 DIAGNOSIS — D123 Benign neoplasm of transverse colon: Secondary | ICD-10-CM

## 2015-12-09 DIAGNOSIS — D122 Benign neoplasm of ascending colon: Secondary | ICD-10-CM

## 2015-12-09 DIAGNOSIS — Z1212 Encounter for screening for malignant neoplasm of rectum: Secondary | ICD-10-CM | POA: Diagnosis not present

## 2015-12-09 MED ORDER — SODIUM CHLORIDE 0.9 % IV SOLN: 500.0000 mL | INTRAVENOUS | Status: DC

## 2015-12-09 NOTE — Patient Instructions (Signed)
YOU HAD AN ENDOSCOPIC PROCEDURE TODAY AT [facility]:   Refer to the procedure report that was given to you for any specific questions about what was found during the examination.  If the procedure report does not answer your questions, please call your gastroenterologist to clarify.  If you requested that your care partner not be given the details of your procedure findings, then the procedure report has been included in a sealed envelope for you to review at your convenience later.  YOU SHOULD EXPECT: Some feelings of bloating in the abdomen. Passage of more gas than usual.  Walking can help get rid of the air that was put into your GI tract during the procedure and reduce the bloating. If you had a lower endoscopy (such as a colonoscopy or flexible sigmoidoscopy) you may notice spotting of blood in your stool or on the toilet paper. If you underwent a bowel prep for your procedure, you may not have a normal bowel movement for a few days.  Please Note:  You might notice some irritation and congestion in your nose or some drainage.  This is from the oxygen used during your procedure.  There is no need for concern and it should clear up in a day or so.  SYMPTOMS TO REPORT IMMEDIATELY:   Following lower endoscopy (colonoscopy or flexible sigmoidoscopy):  Excessive amounts of blood in the stool  Significant tenderness or worsening of abdominal pains  Swelling of the abdomen that is new, acute  Fever of 100F or higher   Following upper endoscopy (EGD)  Vomiting of blood or coffee ground material  New chest pain or pain under the shoulder blades  Painful or persistently difficult swallowing  New shortness of breath  Fever of 100F or higher  Black, tarry-looking stools  For urgent or emergent issues, a gastroenterologist can be reached at any hour by calling [phone].   DIET:  We do recommend a small meal at first, but then you may proceed to your regular diet.  Drink  plenty of fluids but you should avoid alcoholic beverages for 24 hours.  ACTIVITY:  You should plan to take it easy for the rest of today and you should NOT DRIVE or use heavy machinery until tomorrow (because of the sedation medicines used during the test).    FOLLOW UP: Our staff will call the number listed on your records the next business day following your procedure to check on you and address any questions or concerns that you may have regarding the information given to you following your procedure. If we do not reach you, we will leave a message.  However, if you are feeling well and you are not experiencing any problems, there is no need to return our call.  We will assume that you have returned to your regular daily activities without incident.  If any biopsies were taken you will be contacted by phone or by letter within the next 1-3 weeks.  Please call us at [phone] if you have not heard about the biopsies in 3 weeks.    SIGNATURES/CONFIDENTIALITY: You and/or your care partner have signed paperwork which will be entered into your electronic medical record.  These signatures attest to the fact that that the information above on your After Visit Summary has been reviewed and is understood.  Full responsibility of the confidentiality of this discharge information lies with you and/or your care-partner.    Handouts were given to your care partner on polyps  and hemorrhoids. You may resume your current medications today. Await biopsy results. Upper endoscopy was scheduled on Thur. Dec 26, 2015 at 10:00 and a pre-visit Friday Sept. 29 at 3:30. Please call if any questions or concerns.

## 2015-12-09 NOTE — Progress Notes (Signed)
No problems noted in the recovery room. maw 

## 2015-12-09 NOTE — Progress Notes (Signed)
Called to room to assist during endoscopic procedure.  Patient ID and intended procedure confirmed with present staff. Received instructions for my participation in the procedure from the performing physician.  

## 2015-12-09 NOTE — Progress Notes (Signed)
Report given to PACU RN, vss 

## 2015-12-09 NOTE — Op Note (Signed)
[place] Patient Name: Courtney Tran Procedure Date: 12/09/2015 8:01 AM MRN: [id number] Endoscopist: Docia Chuck. Henrene Pastor , MD Age: 50 Referring MD:  Date of Birth: 1965-10-25 Gender: Female Account #: [id number] Procedure:                Colonoscopy, with cold snare polypectomy X3 Indications:              Screening for colorectal malignant neoplasm Medicines:                Monitored Anesthesia Care Procedure:                Pre-Anesthesia Assessment:                           - Prior to the procedure, a History and Physical                            was performed, and patient medications and                            allergies were reviewed. The patient's tolerance of                            previous anesthesia was also reviewed. The risks                            and benefits of the procedure and the sedation                            options and risks were discussed with the patient.                            All questions were answered, and informed consent                            was obtained. Prior Anticoagulants: The patient has                            taken no previous anticoagulant or antiplatelet                            agents. ASA Grade Assessment: II - A patient with                            mild systemic disease. After reviewing the risks                            and benefits, the patient was deemed in                            satisfactory condition to undergo the procedure.                           After obtaining informed consent, the colonoscope  was passed under direct vision. Throughout the                            procedure, the patient's blood pressure, pulse, and                            oxygen saturations were monitored continuously. The                            Model CF-HQ190L [id number]) scope was introduced                            through the anus and advanced to the the cecum,                 identified by appendiceal orifice and ileocecal                            valve. The ileocecal valve, appendiceal orifice,                            and rectum were photographed. The quality of the                            bowel preparation was excellent. The colonoscopy                            was performed without difficulty. The patient                            tolerated the procedure well. The bowel preparation                            used was SUPREP. Scope In: 8:12:08 AM Scope Out: 8:28:43 AM Scope Withdrawal Time: 0 hours 10 minutes 43 seconds  Total Procedure Duration: 0 hours 16 minutes 35 seconds  Findings:                 Three polyps were found in the transverse colon and                            ascending colon. The polyps were 2 to 5 mm in size.                            These polyps were removed with a cold snare.                            Resection and retrieval were complete.                           There was diffuse melanosis coli. Internal                            hemorrhoids were found during retroflexion.  The exam was otherwise without abnormality on                            direct and retroflexion views. Complications:            No immediate complications. Estimated blood loss:                            None. Estimated Blood Loss:     Estimated blood loss: none. Impression:               - Three 2 to 5 mm polyps in the transverse colon                            and in the ascending colon, removed with a cold                            snare. Resected and retrieved.                           - There was diffuse melanosis coli.Internal                            hemorrhoids.                           - The examination was otherwise normal on direct                            and retroflexion views. Recommendation:           - Repeat colonoscopy in 3 years for surveillance.                           -  Schedule the patient for EGD with dilation in [place]                            "intermittent solid food dysphagia".                           - Continue present medications.                           - Await pathology results. Docia Chuck. Henrene Pastor, MD 12/09/2015 8:40:20 AM This report has been signed electronically.

## 2015-12-10 ENCOUNTER — Telehealth: Payer: Self-pay | Admitting: *Deleted

## 2015-12-10 NOTE — Telephone Encounter (Signed)
No answer, left message to call if questions or concerns. 

## 2015-12-12 ENCOUNTER — Encounter: Payer: Self-pay | Admitting: Internal Medicine

## 2015-12-13 ENCOUNTER — Ambulatory Visit (AMBULATORY_SURGERY_CENTER): Payer: Self-pay | Admitting: *Deleted

## 2015-12-13 VITALS — Ht 66.0 in | Wt 212.8 lb

## 2015-12-13 DIAGNOSIS — R1310 Dysphagia, unspecified: Secondary | ICD-10-CM

## 2015-12-13 NOTE — Progress Notes (Signed)
Denies allergies to eggs or soy products. Denies complications with sedation or anesthesia. Denies O2 use. Does use Contrave, for weight loss. Advised to stop for 10 days prior to procedure. Emmi instructions given for colonoscopy.

## 2015-12-16 ENCOUNTER — Encounter: Payer: Self-pay | Admitting: Internal Medicine

## 2015-12-26 ENCOUNTER — Encounter: Payer: Self-pay | Admitting: Internal Medicine

## 2015-12-26 ENCOUNTER — Ambulatory Visit (AMBULATORY_SURGERY_CENTER): Payer: 59 | Admitting: Internal Medicine

## 2015-12-26 VITALS — BP 124/79 | HR 72 | Temp 99.1°F | Resp 15 | Ht 66.0 in | Wt 212.0 lb

## 2015-12-26 DIAGNOSIS — K219 Gastro-esophageal reflux disease without esophagitis: Secondary | ICD-10-CM

## 2015-12-26 DIAGNOSIS — K222 Esophageal obstruction: Secondary | ICD-10-CM

## 2015-12-26 DIAGNOSIS — R1310 Dysphagia, unspecified: Secondary | ICD-10-CM

## 2015-12-26 MED ORDER — SODIUM CHLORIDE 0.9 % IV SOLN: 500.0000 mL | INTRAVENOUS | Status: DC

## 2015-12-26 NOTE — Progress Notes (Signed)
To PACU, vss patent aw report to rn 

## 2015-12-26 NOTE — Op Note (Signed)
[place] Patient Name: Courtney Tran Procedure Date: 12/26/2015 10:24 AM MRN: [id number] Endoscopist: Docia Chuck. Henrene Pastor , MD Age: 50 Referring MD:  Date of Birth: 1965/12/06 Gender: Female Account #: [id number] Procedure:                Upper GI endoscopy, with [facility] dilation 54 f Indications:              Dysphagia Medicines:                Monitored Anesthesia Care Procedure:                Pre-Anesthesia Assessment:                           - Prior to the procedure, a History and Physical                            was performed, and patient medications and                            allergies were reviewed. The patient's tolerance of                            previous anesthesia was also reviewed. The risks                            and benefits of the procedure and the sedation                            options and risks were discussed with the patient.                            All questions were answered, and informed consent                            was obtained. Prior Anticoagulants: The patient has                            taken no previous anticoagulant or antiplatelet                            agents. ASA Grade Assessment: II - A patient with                            mild systemic disease. After reviewing the risks                            and benefits, the patient was deemed in                            satisfactory condition to undergo the procedure.                           After obtaining informed consent, the endoscope was  passed under direct vision. Throughout the                            procedure, the patient's blood pressure, pulse, and                            oxygen saturations were monitored continuously. The                            Model GIF-HQ190 [id number]) scope was introduced                            through the mouth, and advanced to the second part                            of duodenum.  The upper GI endoscopy was                            accomplished without difficulty. The patient                            tolerated the procedure well. Scope In: Scope Out: Findings:                 One moderate (circumferential scarring or stenosis;                            an endoscope may pass) benign-appearing, intrinsic                            stenosis was found 35 cm from the incisors. This                            measured 1.5 cm (inner diameter). The scope was                            withdrawn, after completing the endoscopic survey.                            Dilation was performed with a Maloney dilator with                            no resistance at 9 Fr.                           The exam of the esophagus was otherwise normal.                           The stomach was normal.                           The examined duodenum was normal.                           The cardia and gastric fundus were normal on  retroflexion.Small sliding hiatal hernia. Complications:            No immediate complications. Estimated Blood Loss:     Estimated blood loss: none. Impression:               - Benign-appearing esophageal stenosis. Dilated.                           - Normal stomach.                           - Normal examined duodenum.                           - No specimens collected. Recommendation:           - Patient has a contact number available for                            emergencies. The signs and symptoms of potential                            delayed complications were discussed with the                            patient. Return to normal activities tomorrow.                            Written discharge instructions were provided to the                            patient.                           - Resume previous diet.                           - Continue present medications.                           - Contact Dr. Henrene Pastor if you  have persistent or                            recurrent swallowing problems. Otherwise, follow up                            in the office with Dr. Henrene Pastor in 1 year Docia Chuck. Henrene Pastor, MD 12/26/2015 10:49:24 AM This report has been signed electronically.

## 2015-12-26 NOTE — Patient Instructions (Signed)
YOU HAD AN ENDOSCOPIC PROCEDURE TODAY AT [facility]:   Refer to the procedure report that was given to you for any specific questions about what was found during the examination.  If the procedure report does not answer your questions, please call your gastroenterologist to clarify.  If you requested that your care partner not be given the details of your procedure findings, then the procedure report has been included in a sealed envelope for you to review at your convenience later.  YOU SHOULD EXPECT: Some feelings of bloating in the abdomen. Passage of more gas than usual.  Walking can help get rid of the air that was put into your GI tract during the procedure and reduce the bloating. If you had a lower endoscopy (such as a colonoscopy or flexible sigmoidoscopy) you may notice spotting of blood in your stool or on the toilet paper. If you underwent a bowel prep for your procedure, you may not have a normal bowel movement for a few days.  Please Note:  You might notice some irritation and congestion in your nose or some drainage.  This is from the oxygen used during your procedure.  There is no need for concern and it should clear up in a day or so.  SYMPTOMS TO REPORT IMMEDIATELY:    Following upper endoscopy (EGD)  Vomiting of blood or coffee ground material  New chest pain or pain under the shoulder blades  Painful or persistently difficult swallowing  New shortness of breath  Fever of 100F or higher  Black, tarry-looking stools  For urgent or emergent issues, a gastroenterologist can be reached at any hour by calling [phone].   DIET:  See dilation diet handout-  Drink plenty of fluids but you should avoid alcoholic beverages for 24 hours.  ACTIVITY:  You should plan to take it easy for the rest of today and you should NOT DRIVE or use heavy machinery until tomorrow (because of the sedation medicines used during the test).    FOLLOW UP: Our staff will call the  number listed on your records the next business day following your procedure to check on you and address any questions or concerns that you may have regarding the information given to you following your procedure. If we do not reach you, we will leave a message.  However, if you are feeling well and you are not experiencing any problems, there is no need to return our call.  We will assume that you have returned to your regular daily activities without incident.  If any biopsies were taken you will be contacted by phone or by letter within the next 1-3 weeks.  Please call us at [phone] if you have not heard about the biopsies in 3 weeks.    SIGNATURES/CONFIDENTIALITY: You and/or your care partner have signed paperwork which will be entered into your electronic medical record.  These signatures attest to the fact that that the information above on your After Visit Summary has been reviewed and is understood.  Full responsibility of the confidentiality of this discharge information lies with you and/or your care-partner.  Dilation diet, stricture-handouts given  Follow-up in office in 1 years.

## 2015-12-27 ENCOUNTER — Telehealth: Payer: Self-pay

## 2015-12-27 NOTE — Telephone Encounter (Signed)
  Follow up Call-  Call back number 12/26/2015 12/09/2015  Post procedure Call Back phone  # [phone]  Permission to leave phone message Yes -  Some recent data might be hidden    Patient does not have questions about her care. Yes, was checked in error.    Patient questions:  Do you have a fever, pain , or abdominal swelling? No. Pain Score  0 *  Have you tolerated food without any problems? Yes.    Have you been able to return to your normal activities? Yes.    Do you have any questions about your discharge instructions: Diet   No. Medications  No. Follow up visit  No.  Do you have questions or concerns about your Care? Yes.    Actions: * If pain score is 4 or above: No action needed, pain <4.

## 2016-01-22 ENCOUNTER — Encounter: Payer: Self-pay | Admitting: Internal Medicine

## 2016-03-23 ENCOUNTER — Ambulatory Visit: Payer: 59 | Admitting: Internal Medicine

## 2016-03-24 ENCOUNTER — Encounter: Payer: Self-pay | Admitting: Internal Medicine

## 2016-03-24 ENCOUNTER — Ambulatory Visit (INDEPENDENT_AMBULATORY_CARE_PROVIDER_SITE_OTHER): Payer: 59 | Admitting: Internal Medicine

## 2016-03-24 VITALS — BP 136/86 | HR 97 | Temp 98.3°F | Resp 16 | Wt 218.0 lb

## 2016-03-24 DIAGNOSIS — Z6835 Body mass index (BMI) 35.0-35.9, adult: Secondary | ICD-10-CM

## 2016-03-24 DIAGNOSIS — E6609 Other obesity due to excess calories: Secondary | ICD-10-CM

## 2016-03-24 NOTE — Patient Instructions (Addendum)
Weight loss medications:  Qsymia Belviq  ----   Interacts with pristiq - we should avoid this one [place]

## 2016-03-24 NOTE — Assessment & Plan Note (Signed)
Morbid obesity Discussed decreased portions, healthy diet and importance of regular exercise  - stressed that this is the only way to lose weight Would like to consider another medication to help with weight loss Discussed phentermine - I do not think this is a good option Discussed belivq - can not take with pristiq Discussed qsymia and saxenda -- reviewed both, discussed possible side effects She will call her insurance company and see what is covered and let me know Will have her follow up in 4 months or so after starting medication

## 2016-03-24 NOTE — Progress Notes (Signed)
Pre visit review using our clinic review tool, if applicable. No additional management support is needed unless otherwise documented below in the visit note. 

## 2016-03-24 NOTE — Progress Notes (Signed)
Subjective:    Patient ID: Courtney Tran, female    DOB: [birth year], 51 y.o.   MRN: [id number]  HPI The patient is here for follow up.  Obesity:  She was started on Contrave June 2017 - she stopped two months ago.  She had a dry mouth and that is why she stopped it.  She is walking her dogs 2 miles a day. Her diet is not as good as it should be.    Wt Readings from Last 3 Encounters:  03/24/16 218 lb (98.9 kg)  12/26/15 212 lb (96.2 kg)  12/13/15 212 lb 12.8 oz (96.5 kg)  09/20/15          205 08/23/15          207 12/26/14          208   She would like to consider another medication to help her lose weight.     Medications and allergies reviewed with patient and updated if appropriate.  Patient Active Problem List   Diagnosis Date Noted  . Ganglion cyst of wrist 08/23/2015  . Peroneal tendinitis 12/05/2014  . Dyslipidemia   . Anxiety 10/05/2011  . Obesity 08/21/2008  . Depression 08/21/2008  . ROSACEA 08/21/2008  . GALLSTONES 05/04/2008  . ESOPHAGEAL STRICTURE 03/14/2007  . Esophageal reflux 03/14/2007    Current Outpatient Prescriptions on File Prior to Visit  Medication Sig Dispense Refill  . B Complex-C (B-COMPLEX WITH VITAMIN C) tablet Take 1 tablet by mouth daily.    . cholecalciferol (VITAMIN D) 1000 UNITS tablet Take 1,000 Units by mouth daily.    . cyanocobalamin 100 MCG tablet Take 100 mcg by mouth daily.    Marland Kitchen desvenlafaxine (PRISTIQ) 50 MG 24 hr tablet Take 1 tablet (50 mg total) by mouth daily. 90 tablet 2  . doxycycline (VIBRA-TABS) 100 MG tablet Take 1 tablet (100 mg total) by mouth daily. 90 tablet 3  . esomeprazole (NEXIUM) 40 MG capsule Take 1 capsule (40 mg total) by mouth daily before breakfast. 30 capsule 5  . Fexofenadine HCl (ALLEGRA ALLERGY PO) Take by mouth.    Marland Kitchen PAZEO 0.7 % SOLN   0  . Prenatal Vit-Fe Fumarate-FA (PRENATAL MULTIVITAMIN) TABS tablet Take 1 tablet by mouth daily at 12 noon.    . Thiamine HCl (VITAMIN B-1) 250 MG tablet  Take 250 mg by mouth daily.    Marland Kitchen tretinoin (RETIN-A) 0.1 % cream Apply topically at bedtime.      . vitamin E 100 UNIT capsule Take 100 Units by mouth.    . Naltrexone-Bupropion HCl ER (CONTRAVE) 8-90 MG TB12 1 tab PO qam x1wk, then 1 tab PO bid x1wk, then 2 tabs PO qam and 1 tab PO qpm x1wk; then take 2 tabs PO bid (Patient not taking: Reported on 03/24/2016) 120 tablet 3   No current facility-administered medications on file prior to visit.     Past Medical History:  Diagnosis Date  . Basal cell carcinoma, face 2013   s/p excision, multiple basal cells  . DEPRESSION   . Dyslipidemia   . Esophageal reflux   . Esophageal stricture 08/2005   EGD with dilation   . GALLSTONES   . Obesity, unspecified   . Rosacea   . Seasonal allergies   . Uterine fibroid     Past Surgical History:  Procedure Laterality Date  . ABDOMINAL HYSTERECTOMY  03/06/13  . COLON RESECTION     colon resection when pt was infant  .  EXTERNAL EAR SURGERY    . ROBOTIC ASSISTED TOTAL HYSTERECTOMY N/A 03/06/2013   Procedure: ROBOTIC ASSISTED TOTAL HYSTERECTOMY WITH BILATERAL SALPINGECTOMY;  Surgeon: Princess Bruins, MD;  Location: [facility];  Service: Gynecology;  Laterality: N/A;  . Ruptured bowel repair  1967  . WISDOM TOOTH EXTRACTION      Social History   Social History  . Marital status: Married    Spouse name: N/A  . Number of children: N/A  . Years of education: N/A   Social History Main Topics  . Smoking status: Never Smoker  . Smokeless tobacco: Never Used  . Alcohol use 0.0 oz/week     Comment: weekends  . Drug use: No  . Sexual activity: Not on file   Other Topics Concern  . Not on file   Social History Narrative  . No narrative on file    Family History  Problem Relation Age of Onset  . Cirrhosis Mother   . Hyperlipidemia Father   . Hypertension Father   . Colon polyps Father   . Diabetes Mellitus I Father 1  . Diabetes Maternal Aunt   . Colon cancer Paternal Uncle 97  . Colon  polyps Brother   . Colon cancer Paternal Aunt 16    Review of Systems     Objective:   Vitals:   03/24/16 1550  BP: 136/86  Pulse: 97  Resp: 16  Temp: 98.3 F (36.8 C)   Wt Readings from Last 3 Encounters:  03/24/16 218 lb (98.9 kg)  12/26/15 212 lb (96.2 kg)  12/13/15 212 lb 12.8 oz (96.5 kg)   Body mass index is 35.19 kg/m.   Physical Exam  Constitutional: She appears well-developed and well-nourished. No distress.  Skin: She is not diaphoretic.  Psychiatric: She has a normal mood and affect. Her behavior is normal. Judgment and thought content normal.        Assessment & Plan:    See Problem List for Assessment and Plan of chronic medical problems.    20 minutes were spent face-to-face with the patient, over 50% of which was spent counseling regarding obesity and treatment options

## 2016-03-29 ENCOUNTER — Encounter: Payer: Self-pay | Admitting: Internal Medicine

## 2016-03-29 ENCOUNTER — Other Ambulatory Visit: Payer: Self-pay | Admitting: Internal Medicine

## 2016-03-30 NOTE — Telephone Encounter (Signed)
Please advise what should be sent in.

## 2016-03-30 NOTE — Telephone Encounter (Signed)
Just make sure she does not want to try the medication for a month first locally in case she does not tolerate it. She should follow up with me after 6-8 weeks after starting on the medication.   meds pending - just need to adjust quantity sent (pending for one month supply)

## 2016-03-31 ENCOUNTER — Encounter: Payer: Self-pay | Admitting: Internal Medicine

## 2016-03-31 NOTE — Telephone Encounter (Signed)
LVM for pt to call back and discuss.  

## 2016-04-01 ENCOUNTER — Encounter: Payer: Self-pay | Admitting: Internal Medicine

## 2016-04-01 MED ORDER — INSULIN PEN NEEDLE 32G X 4 MM MISC: 1 refills | Status: DC

## 2016-04-01 MED ORDER — LIRAGLUTIDE -WEIGHT MANAGEMENT 18 MG/3ML ~~LOC~~ SOPN: 0.6000 mg | PEN_INJECTOR | Freq: Every day | SUBCUTANEOUS | 1 refills | Status: DC

## 2016-04-08 NOTE — Telephone Encounter (Signed)
LVM to follow-up with pt and see what she would like done with the Nord.

## 2016-04-09 ENCOUNTER — Encounter: Payer: Self-pay | Admitting: Internal Medicine

## 2016-04-19 ENCOUNTER — Encounter: Payer: Self-pay | Admitting: Internal Medicine

## 2016-04-20 NOTE — Telephone Encounter (Signed)
Ok to send

## 2016-04-21 ENCOUNTER — Other Ambulatory Visit: Payer: Self-pay | Admitting: Emergency Medicine

## 2016-04-21 MED ORDER — INSULIN PEN NEEDLE 32G X 4 MM MISC
1 refills | Status: DC
Start: 2016-04-21 — End: 2017-05-24

## 2016-04-21 MED ORDER — LIRAGLUTIDE -WEIGHT MANAGEMENT 18 MG/3ML ~~LOC~~ SOPN
0.6000 mg | PEN_INJECTOR | Freq: Every day | SUBCUTANEOUS | 1 refills | Status: DC
Start: 2016-04-21 — End: 2017-05-24

## 2016-04-23 ENCOUNTER — Telehealth: Payer: Self-pay | Admitting: Emergency Medicine

## 2016-04-23 NOTE — Telephone Encounter (Signed)
[facility] needs some clarification on the instruction for patients Liraglutide -Weight Management (SAXENDA) 18 MG/3ML SOPN. Reference # is [id number]. Phone # is [phone]. Please advise thanks.

## 2016-04-23 NOTE — Telephone Encounter (Signed)
Spoke with [facility] to clarify.

## 2016-07-01 ENCOUNTER — Other Ambulatory Visit: Payer: Self-pay | Admitting: Internal Medicine

## 2016-11-13 ENCOUNTER — Encounter: Payer: Self-pay | Admitting: Internal Medicine

## 2016-12-26 ENCOUNTER — Encounter: Payer: Self-pay | Admitting: Internal Medicine

## 2016-12-26 MED ORDER — DOXYCYCLINE HYCLATE 100 MG PO TABS: 100.0000 mg | ORAL_TABLET | Freq: Every day | ORAL | 0 refills | Status: DC

## 2016-12-26 MED ORDER — DESVENLAFAXINE SUCCINATE ER 50 MG PO TB24: 50.0000 mg | ORAL_TABLET | Freq: Every day | ORAL | 0 refills | Status: DC

## 2017-03-31 ENCOUNTER — Other Ambulatory Visit: Payer: Self-pay | Admitting: Internal Medicine

## 2017-04-27 ENCOUNTER — Other Ambulatory Visit: Payer: Self-pay | Admitting: Internal Medicine

## 2017-05-22 NOTE — Progress Notes (Signed)
Subjective:    Patient ID: Courtney Tran, female    DOB: 1965-04-11, 52 y.o.   MRN: [id number]  HPI The patient is here for follow up.  Depression: She is taking her medication daily as prescribed. She denies any side effects from the medication. She feels her depression is well controlled and she is happy with her current dose of medication.   Anxiety: She is taking her medication daily as prescribed. She denies any side effects from the medication. She feels her anxiety is well controlled and she is happy with her current dose of medication.   GERD:  She is taking otc medication daily.  She denies any GERD symptoms and feels her GERD is well controlled.   Obesity:  She has taken some weight loss medications.  She is walking irregularly.  She is thinking about doing the keto diet.    Rosacea:  She takes doxycycline daily.  Her rosacea and acne are well controlled on her current dose.    Medications and allergies reviewed with patient and updated if appropriate.  Patient Active Problem List   Diagnosis Date Noted  . Ganglion cyst of wrist 08/23/2015  . Peroneal tendinitis 12/05/2014  . Dyslipidemia   . Anxiety 10/05/2011  . Obesity 08/21/2008  . Depression 08/21/2008  . ROSACEA 08/21/2008  . GALLSTONES 05/04/2008  . ESOPHAGEAL STRICTURE 03/14/2007  . Esophageal reflux 03/14/2007    Current Outpatient Medications on File Prior to Visit  Medication Sig Dispense Refill  . cholecalciferol (VITAMIN D) 1000 UNITS tablet Take 1,000 Units by mouth daily.    . cyanocobalamin 100 MCG tablet Take 100 mcg by mouth daily.    Marland Kitchen esomeprazole (NEXIUM) 40 MG capsule Take 1 capsule (40 mg total) by mouth daily before breakfast. 30 capsule 5  . Fexofenadine HCl (ALLEGRA ALLERGY PO) Take by mouth.    . Prenatal Vit-Fe Fumarate-FA (PRENATAL MULTIVITAMIN) TABS tablet Take 1 tablet by mouth daily at 12 noon.    . Thiamine HCl (VITAMIN B-1) 250 MG tablet Take 250 mg by mouth daily.    .  vitamin E 100 UNIT capsule Take 100 Units by mouth.     No current facility-administered medications on file prior to visit.     Past Medical History:  Diagnosis Date  . Basal cell carcinoma, face 2013   s/p excision, multiple basal cells  . DEPRESSION   . Dyslipidemia   . Esophageal reflux   . Esophageal stricture 08/2005   EGD with dilation   . GALLSTONES   . Obesity, unspecified   . Rosacea   . Seasonal allergies   . Uterine fibroid     Past Surgical History:  Procedure Laterality Date  . ABDOMINAL HYSTERECTOMY  03/06/13  . COLON RESECTION     colon resection when pt was infant  . EXTERNAL EAR SURGERY    . ROBOTIC ASSISTED TOTAL HYSTERECTOMY N/A 03/06/2013   Procedure: ROBOTIC ASSISTED TOTAL HYSTERECTOMY WITH BILATERAL SALPINGECTOMY;  Surgeon: Princess Bruins, MD;  Location: [facility];  Service: Gynecology;  Laterality: N/A;  . Ruptured bowel repair  1967  . WISDOM TOOTH EXTRACTION      Social History   Socioeconomic History  . Marital status: Married    Spouse name: None  . Number of children: None  . Years of education: None  . Highest education level: None  Social Needs  . Financial resource strain: None  . Food insecurity - worry: None  . Food insecurity - inability: None  .  Transportation needs - medical: None  . Transportation needs - non-medical: None  Occupational History  . None  Tobacco Use  . Smoking status: Never Smoker  . Smokeless tobacco: Never Used  Substance and Sexual Activity  . Alcohol use: Yes    Alcohol/week: 0.0 oz    Comment: weekends  . Drug use: No  . Sexual activity: None  Other Topics Concern  . None  Social History Narrative  . None    Family History  Problem Relation Age of Onset  . Cirrhosis Mother   . Hyperlipidemia Father   . Hypertension Father   . Colon polyps Father   . Diabetes Mellitus I Father 30  . Diabetes Maternal Aunt   . Colon cancer Paternal Uncle 36  . Colon polyps Brother   . Colon cancer  Paternal Aunt 17    Review of Systems  Constitutional: Negative for chills and fever.  HENT: Positive for congestion.   Respiratory: Negative for cough, shortness of breath and wheezing.   Cardiovascular: Negative for chest pain, palpitations and leg swelling.  Neurological: Negative for light-headedness and headaches.       Objective:   Vitals:   05/24/17 0950  BP: 136/88  Pulse: 82  Resp: 16  Temp: 97.8 F (36.6 C)  SpO2: 98%   BP Readings from Last 3 Encounters:  05/24/17 136/88  03/24/16 136/86  12/26/15 124/79   Wt Readings from Last 3 Encounters:  05/24/17 226 lb (102.5 kg)  03/24/16 218 lb (98.9 kg)  12/26/15 212 lb (96.2 kg)   Body mass index is 36.48 kg/m.   Physical Exam    Constitutional: Appears well-developed and well-nourished. No distress.  HENT:  Head: Normocephalic and atraumatic.  Neck: Neck supple. No tracheal deviation present. No thyromegaly present.  No cervical lymphadenopathy Cardiovascular: Normal rate, regular rhythm and normal heart sounds.   No murmur heard. No carotid bruit .  No edema Pulmonary/Chest: Effort normal and breath sounds normal. No respiratory distress. No has no wheezes. No rales.  Skin: Skin is warm and dry. Not diaphoretic.  Psychiatric: Normal mood and affect. Behavior is normal.      Assessment & Plan:    See Problem List for Assessment and Plan of chronic medical problems.

## 2017-05-24 ENCOUNTER — Ambulatory Visit (INDEPENDENT_AMBULATORY_CARE_PROVIDER_SITE_OTHER): Payer: Self-pay | Admitting: Internal Medicine

## 2017-05-24 ENCOUNTER — Encounter: Payer: Self-pay | Admitting: Internal Medicine

## 2017-05-24 VITALS — BP 136/88 | HR 82 | Temp 97.8°F | Resp 16 | Ht 66.0 in | Wt 226.0 lb

## 2017-05-24 DIAGNOSIS — F419 Anxiety disorder, unspecified: Secondary | ICD-10-CM

## 2017-05-24 DIAGNOSIS — L719 Rosacea, unspecified: Secondary | ICD-10-CM

## 2017-05-24 DIAGNOSIS — Z6835 Body mass index (BMI) 35.0-35.9, adult: Secondary | ICD-10-CM

## 2017-05-24 DIAGNOSIS — K219 Gastro-esophageal reflux disease without esophagitis: Secondary | ICD-10-CM

## 2017-05-24 DIAGNOSIS — E6609 Other obesity due to excess calories: Secondary | ICD-10-CM

## 2017-05-24 DIAGNOSIS — F3289 Other specified depressive episodes: Secondary | ICD-10-CM

## 2017-05-24 MED ORDER — DOXYCYCLINE HYCLATE 100 MG PO TABS: 100.0000 mg | ORAL_TABLET | Freq: Every day | ORAL | 3 refills | Status: DC

## 2017-05-24 MED ORDER — DESVENLAFAXINE SUCCINATE ER 50 MG PO TB24: 50.0000 mg | ORAL_TABLET | Freq: Every day | ORAL | 3 refills | Status: DC

## 2017-05-24 NOTE — Assessment & Plan Note (Signed)
Controlled, stable Continue current dose of medication  

## 2017-05-24 NOTE — Assessment & Plan Note (Signed)
Will try keto diet Stressed regular exercise

## 2017-05-24 NOTE — Patient Instructions (Addendum)
All other Health Maintenance issues reviewed.   All recommended immunizations and age-appropriate screenings are up-to-date or discussed.  No immunizations administered today.   Medications reviewed and updated.  No changes recommended at this time.  Your prescription(s) have been submitted to your pharmacy. Please take as directed and contact our office if you believe you are having problem(s) with the medication(s).   Please followup in one year

## 2017-05-24 NOTE — Assessment & Plan Note (Signed)
Taking otc medication Continue otc medication daily Working weight loss

## 2017-11-19 ENCOUNTER — Encounter: Payer: Self-pay | Admitting: Internal Medicine

## 2017-12-07 ENCOUNTER — Other Ambulatory Visit: Payer: Self-pay | Admitting: Obstetrics & Gynecology

## 2017-12-07 DIAGNOSIS — Z1231 Encounter for screening mammogram for malignant neoplasm of breast: Secondary | ICD-10-CM

## 2017-12-13 ENCOUNTER — Ambulatory Visit
Admission: RE | Admit: 2017-12-13 | Discharge: 2017-12-13 | Disposition: A | Discharge status: Home / Self Care | Payer: No Typology Code available for payment source | Source: Ambulatory Visit

## 2017-12-13 DIAGNOSIS — Z1231 Encounter for screening mammogram for malignant neoplasm of breast: Secondary | ICD-10-CM

## 2018-02-20 ENCOUNTER — Encounter: Payer: Self-pay | Admitting: Internal Medicine

## 2018-02-21 ENCOUNTER — Other Ambulatory Visit: Payer: Self-pay

## 2018-02-21 MED ORDER — DESVENLAFAXINE SUCCINATE ER 50 MG PO TB24: 50.0000 mg | ORAL_TABLET | Freq: Every day | ORAL | 3 refills | Status: DC

## 2018-02-21 MED ORDER — DOXYCYCLINE HYCLATE 100 MG PO TABS: 100.0000 mg | ORAL_TABLET | Freq: Every day | ORAL | 3 refills | Status: DC

## 2018-02-21 NOTE — Addendum Note (Signed)
Addended by: Terence Lux B on: 02/21/2018 11:59 AM   Modules accepted: Orders

## 2018-03-23 ENCOUNTER — Ambulatory Visit (INDEPENDENT_AMBULATORY_CARE_PROVIDER_SITE_OTHER): Payer: Self-pay | Admitting: Obstetrics & Gynecology

## 2018-03-23 ENCOUNTER — Encounter: Payer: Self-pay | Admitting: Obstetrics & Gynecology

## 2018-03-23 VITALS — BP 146/84 | Ht 66.0 in | Wt 221.0 lb

## 2018-03-23 DIAGNOSIS — Z6835 Body mass index (BMI) 35.0-35.9, adult: Secondary | ICD-10-CM

## 2018-03-23 DIAGNOSIS — Z01419 Encounter for gynecological examination (general) (routine) without abnormal findings: Secondary | ICD-10-CM

## 2018-03-23 DIAGNOSIS — E6609 Other obesity due to excess calories: Secondary | ICD-10-CM

## 2018-03-23 DIAGNOSIS — N951 Menopausal and female climacteric states: Secondary | ICD-10-CM

## 2018-03-23 DIAGNOSIS — Z90710 Acquired absence of both cervix and uterus: Secondary | ICD-10-CM

## 2018-03-23 MED ORDER — ESTRADIOL 0.05 MG/24HR TD PTTW: 1.0000 | MEDICATED_PATCH | TRANSDERMAL | 4 refills | Status: DC

## 2018-03-23 NOTE — Patient Instructions (Signed)
1. Well female exam with routine gynecological exam Gynecologic exam status post total hysterectomy and menopause.  Pap reflex done on the vaginal vault.  Breast exam normal.  Screening mammogram in September 2019 was negative.  Colonoscopy in 2017.  Health labs with family physician.  2. S/P total hysterectomy  3. Menopausal syndrome Symptomatic menopause with vasomotor symptoms and vaginal dryness.  No contraindication to hormone replacement therapy.  Counseling on hormone replacement therapy done thoroughly including benefits and risks.  Risks of the breast cancer after 10 years of use reviewed and risks of blood clots including pulmonary embolism and strokes increasing with age reviewed.  Benefits on cardio protection and bone protection reviewed.  Benefits on controlling the menopausal symptoms and general benefits on sleep and mood reviewed.  Decision to start on estradiol patch 0.05 twice weekly.  Usage reviewed.  Prescription sent to pharmacy.  4. Class 2 obesity due to excess calories without serious comorbidity with body mass index (BMI) of 35.0 to 35.9 in adult Recommend a lower calorie/carb diet such as [employer].  Also recommend increasing physical activity with aerobic activities 5 times a week and weightlifting every 2 days.  Other orders - estradiol (VIVELLE-DOT) 0.05 MG/24HR patch; Place 1 patch (0.05 mg total) onto the skin 2 (two) times a week.  Maudie Mercury, it was a pleasure seeing you today!  I will inform you of your results as soon as they are available.

## 2018-03-23 NOTE — Progress Notes (Signed)
Courtney Tran Jul 15, 1965 [id number]   History:    53 y.o. G0 Married  RP:  Established patient presenting for annual gyn exam   HPI: Status post robotic TLH with bilateral salpingectomy in January 2015.  No pelvic pain.  Experiencing vasomotor menopausal symptoms with hot flashes.  Also having difficulty sleeping.  Dryness with intercourse.  Urine and bowel movements normal.  Breasts normal.  Body mass index 35.67.  Intentions to lose weight with decrease calorie nutrition and starting to walk.  Health labs with family physician.  Past medical history,surgical history, family history and social history were all reviewed and documented in the EPIC chart.  Gynecologic History Patient's last menstrual period was 03/02/2013. Contraception: status post hysterectomy Last Pap: 11/2012. Results were: Negative/HPV HR neg Last mammogram: 11/2017. Results were: Negative Bone Density: Never Colonoscopy: 2017  Obstetric History OB History  Gravida Para Term Preterm AB Living  0 0 0 0 0 0  SAB TAB Ectopic Multiple Live Births  0 0 0 0 0     ROS: A ROS was performed and pertinent positives and negatives are included in the history.  GENERAL: No fevers or chills. HEENT: No change in vision, no earache, sore throat or sinus congestion. NECK: No pain or stiffness. CARDIOVASCULAR: No chest pain or pressure. No palpitations. PULMONARY: No shortness of breath, cough or wheeze. GASTROINTESTINAL: No abdominal pain, nausea, vomiting or diarrhea, melena or bright red blood per rectum. GENITOURINARY: No urinary frequency, urgency, hesitancy or dysuria. MUSCULOSKELETAL: No joint or muscle pain, no back pain, no recent trauma. DERMATOLOGIC: No rash, no itching, no lesions. ENDOCRINE: No polyuria, polydipsia, no heat or cold intolerance. No recent change in weight. HEMATOLOGICAL: No anemia or easy bruising or bleeding. NEUROLOGIC: No headache, seizures, numbness, tingling or weakness. PSYCHIATRIC: No  depression, no loss of interest in normal activity or change in sleep pattern.     Exam:   BP (!) 146/84   Ht 5\' 6"  (1.676 m)   Wt 221 lb (100.2 kg)   LMP 03/02/2013   BMI 35.67 kg/m   Body mass index is 35.67 kg/m.  General appearance : Well developed well nourished female. No acute distress HEENT: Eyes: no retinal hemorrhage or exudates,  Neck supple, trachea midline, no carotid bruits, no thyroidmegaly Lungs: Clear to auscultation, no rhonchi or wheezes, or rib retractions  Heart: Regular rate and rhythm, no murmurs or gallops Breast:Examined in sitting and supine position were symmetrical in appearance, no palpable masses or tenderness,  no skin retraction, no nipple inversion, no nipple discharge, no skin discoloration, no axillary or supraclavicular lymphadenopathy Abdomen: no palpable masses or tenderness, no rebound or guarding Extremities: no edema or skin discoloration or tenderness  Pelvic: Vulva: Normal             Vagina: No gross lesions or discharge.  Pap reflex done  Cervix/Uterus absent  Adnexa  Without masses or tenderness  Anus: Normal   Assessment/Plan:  53 y.o. female for annual exam   1. Well female exam with routine gynecological exam Gynecologic exam status post total hysterectomy and menopause.  Pap reflex done on the vaginal vault.  Breast exam normal.  Screening mammogram in September 2019 was negative.  Colonoscopy in 2017.  Health labs with family physician.  2. S/P total hysterectomy  3. Menopausal syndrome Symptomatic menopause with vasomotor symptoms and vaginal dryness.  No contraindication to hormone replacement therapy.  Counseling on hormone replacement therapy done thoroughly including benefits and risks.  Risks  of the breast cancer after 10 years of use reviewed and risks of blood clots including pulmonary embolism and strokes increasing with age reviewed.  Benefits on cardio protection and bone protection reviewed.  Benefits on controlling  the menopausal symptoms and general benefits on sleep and mood reviewed.  Decision to start on estradiol patch 0.05 twice weekly.  Usage reviewed.  Prescription sent to pharmacy.  4. Class 2 obesity due to excess calories without serious comorbidity with body mass index (BMI) of 35.0 to 35.9 in adult Recommend a lower calorie/carb diet such as [employer].  Also recommend increasing physical activity with aerobic activities 5 times a week and weightlifting every 2 days.  Other orders - estradiol (VIVELLE-DOT) 0.05 MG/24HR patch; Place 1 patch (0.05 mg total) onto the skin 2 (two) times a week.  Princess Bruins MD, 8:20 AM 03/23/2018

## 2018-03-23 NOTE — Addendum Note (Signed)
Addended by: Thurnell Garbe A on: 03/23/2018 09:19 AM   Modules accepted: Orders

## 2018-03-25 LAB — PAP IG W/ RFLX HPV ASCU

## 2018-06-14 MED ORDER — ESTRADIOL 0.05 MG/24HR TD PTTW: 1.0000 | MEDICATED_PATCH | TRANSDERMAL | 3 refills | Status: DC

## 2018-09-08 ENCOUNTER — Encounter: Payer: Self-pay | Admitting: Internal Medicine

## 2018-09-15 ENCOUNTER — Ambulatory Visit: Payer: Self-pay

## 2018-09-15 ENCOUNTER — Ambulatory Visit: Payer: No Typology Code available for payment source | Admitting: Family Medicine

## 2018-09-15 ENCOUNTER — Encounter: Payer: Self-pay | Admitting: Family Medicine

## 2018-09-15 ENCOUNTER — Other Ambulatory Visit: Payer: Self-pay

## 2018-09-15 VITALS — BP 122/92 | HR 90 | Ht 66.0 in

## 2018-09-15 DIAGNOSIS — M25522 Pain in left elbow: Secondary | ICD-10-CM | POA: Diagnosis not present

## 2018-09-15 DIAGNOSIS — M7712 Lateral epicondylitis, left elbow: Secondary | ICD-10-CM

## 2018-09-15 MED ORDER — PENNSAID 2 % TD SOLN: 2.0000 g | Freq: Two times a day (BID) | TRANSDERMAL | 3 refills | Status: DC

## 2018-09-15 MED ORDER — VITAMIN D (ERGOCALCIFEROL) 1.25 MG (50000 UNIT) PO CAPS: 50000.0000 [IU] | ORAL_CAPSULE | ORAL | 0 refills | Status: DC

## 2018-09-15 NOTE — Progress Notes (Signed)
[facility] [address], [place] [address] Phone: [phone] Subjective:   Courtney Tran, am serving as a scribe for Dr. Hulan Saas.  I'm seeing this patient by the request  of:    Binnie Rail, MD   CC: Left elbow pain  [id number]  Courtney Tran is a 53 y.o. female coming in with complaint of left elbow pain. Last seen in 2016 for peroneal tendonitis. Lateral epi pain. Has been walking dog who has been pulling on her arm. Also notes using phone in left hand a lot. Picking up water dish and gallon of milk is painful. Pain with elbow extension. Has been using Biofreeze. Denies any radicular symptoms.      Past Medical History:  Diagnosis Date  . Basal cell carcinoma, face 2013   s/p excision, multiple basal cells  . DEPRESSION   . Dyslipidemia   . Esophageal reflux   . Esophageal stricture 08/2005   EGD with dilation   . GALLSTONES   . Obesity, unspecified   . Rosacea   . Seasonal allergies   . Uterine fibroid    Past Surgical History:  Procedure Laterality Date  . ABDOMINAL HYSTERECTOMY  03/06/13  . COLON RESECTION     colon resection when pt was infant  . EXTERNAL EAR SURGERY    . ROBOTIC ASSISTED TOTAL HYSTERECTOMY N/A 03/06/2013   Procedure: ROBOTIC ASSISTED TOTAL HYSTERECTOMY WITH BILATERAL SALPINGECTOMY;  Surgeon: Princess Bruins, MD;  Location: [facility];  Service: Gynecology;  Laterality: N/A;  . Ruptured bowel repair  1967  . WISDOM TOOTH EXTRACTION     Social History   Socioeconomic History  . Marital status: Married    Spouse name: Not on file  . Number of children: Not on file  . Years of education: Not on file  . Highest education level: Not on file  Occupational History  . Not on file  Social Needs  . Financial resource strain: Not on file  . Food insecurity    Worry: Not on file    Inability: Not on file  . Transportation needs    Medical: Not on file    Non-medical: Not on file  Tobacco  Use  . Smoking status: Never Smoker  . Smokeless tobacco: Never Used  Substance and Sexual Activity  . Alcohol use: Yes    Alcohol/week: 0.0 standard drinks    Comment: weekends  . Drug use: Tran  . Sexual activity: Yes    Partners: Male    Comment: 1st intercourse- 23, partners- 7, married- 23 yrs   Lifestyle  . Physical activity    Days per week: Not on file    Minutes per session: Not on file  . Stress: Not on file  Relationships  . Social Herbalist on phone: Not on file    Gets together: Not on file    Attends religious service: Not on file    Active member of club or organization: Not on file    Attends meetings of clubs or organizations: Not on file    Relationship status: Not on file  Other Topics Concern  . Not on file  Social History Narrative  . Not on file   Allergies  Allergen Reactions  . Contrave [Naltrexone-Bupropion Hcl Er]     Dry mouth  . Lactose Intolerance (Gi)   . Latex Rash   Family History  Problem Relation Age of Onset  . Cirrhosis Mother   .  Hyperlipidemia Father   . Hypertension Father   . Colon polyps Father   . Diabetes Mellitus I Father 56  . Diabetes Maternal Aunt   . Colon cancer Paternal Uncle 72  . Colon polyps Brother   . Colon cancer Paternal Aunt 78    Current Outpatient Medications (Endocrine & Metabolic):  .  estradiol (VIVELLE-DOT) 0.05 MG/24HR patch, Place 1 patch (0.05 mg total) onto the skin 2 (two) times a week.   Current Outpatient Medications (Respiratory):  Marland Kitchen  Fexofenadine HCl (ALLEGRA ALLERGY PO), Take by mouth.   Current Outpatient Medications (Hematological):  .  cyanocobalamin 100 MCG tablet, Take 100 mcg by mouth daily.  Current Outpatient Medications (Other):  .  cholecalciferol (VITAMIN D) 1000 UNITS tablet, Take 1,000 Units by mouth daily. Marland Kitchen  desvenlafaxine (PRISTIQ) 50 MG 24 hr tablet, Take 1 tablet (50 mg total) by mouth daily. Marland Kitchen  doxycycline (VIBRA-TABS) 100 MG tablet, Take 1 tablet (100  mg total) by mouth daily. Marland Kitchen  esomeprazole (NEXIUM) 40 MG capsule, Take 1 capsule (40 mg total) by mouth daily before breakfast. .  Prenatal Vit-Fe Fumarate-FA (PRENATAL MULTIVITAMIN) TABS tablet, Take 1 tablet by mouth daily at 12 noon. .  Thiamine HCl (VITAMIN B-1) 250 MG tablet, Take 250 mg by mouth daily. .  vitamin E 100 UNIT capsule, Take 100 Units by mouth. .  Diclofenac Sodium (PENNSAID) 2 % SOLN, Place 2 g onto the skin 2 (two) times daily. .  Vitamin D, Ergocalciferol, (DRISDOL) 1.25 MG (50000 UT) CAPS capsule, Take 1 capsule (50,000 Units total) by mouth every 7 (seven) days.    Past medical history, social, surgical and family history all reviewed in electronic medical record.  Tran pertanent information unless stated regarding to the chief complaint.   Review of Systems:  Tran headache, visual changes, nausea, vomiting, diarrhea, constipation, dizziness, abdominal pain, skin rash, fevers, chills, night sweats, weight loss, swollen lymph nodes, body aches, joint swelling, chest pain, shortness of breath, mood changes.  Positive muscle aches  Objective  Blood pressure (!) 122/92, pulse 90, height 5\' 6"  (1.676 m), last menstrual period 03/02/2013, SpO2 98 %.    General: Tran apparent distress alert and oriented x3 mood and affect normal, dressed appropriately.  HEENT: Pupils equal, extraocular movements intact  Respiratory: Patient's speak in full sentences and does not appear short of breath  Cardiovascular: Tran lower extremity edema, non tender, Tran erythema  Skin: Warm dry intact with Tran signs of infection or rash on extremities or on axial skeleton.  Abdomen: Soft nontender  Neuro: Cranial nerves II through XII are intact, neurovascularly intact in all extremities with 2+ DTRs and 2+ pulses.  Lymph: Tran lymphadenopathy of posterior or anterior cervical chain or axillae bilaterally.  Gait normal with good balance and coordination.  MSK:  Non tender with full range of motion and good  stability and symmetric strength and tone of shoulders, wrist, hip, knee and ankles bilaterally.    Epicondylar region is severely tender to palpation.  Patient also has some pain with some mild weakness with resisted wrist extension on the left side greater than right.  evaluation elbow noted for full range of motion.  Lacks last 2 degrees of extension   Limited musculoskeletal ultrasound was performed and interpreted by Lyndal Pulley  Limited ultrasound shows the patient does have an avulsion fracture noted of the lateral epicondylar region.  Some mild intrasubstance tearing's of the common extensor tendon.  Significant increase in Doppler flow noted.  Impression: Avulsion of the common extensor tendon   Impression and Recommendations:     This case required medical decision making of moderate complexity. The above documentation has been reviewed and is accurate and complete Lyndal Pulley, DO       Note: This dictation was prepared with Dragon dictation along with smaller phrase technology. Any transcriptional errors that result from this process are unintentional.

## 2018-09-15 NOTE — Assessment & Plan Note (Signed)
Elbow anatomy was reviewed, and tendinopathy was explained.  Pt. given a home rehab program. Start with isometrics and ROM, then a series of concentric and eccentric exercises should be done starting with no weight, work up to 1 lb, hammer, etc.  Use counterforce strap if working or using hands.  Formal PT would be beneficial. Emphasized stretching an cross-friction massage Emphasized proper palms up lifting biomechanics to unload ECRB Patient did have unfortunately small avulsion fracture.  Encourage increasing vitamin D to at least 50,000 weekly.  Discussed topical anti-inflammatories.  Icing regimen.  Wrist brace given to avoid the extension.  Follow-up again in 4 weeks

## 2018-09-15 NOTE — Patient Instructions (Signed)
K2 over the counter any dose Avoid picking things up overhand See me again in 3-4 weeks

## 2018-10-09 NOTE — Progress Notes (Signed)
[name] [place], [place] [address] Phone: [phone] Subjective:    I'm seeing this patient by the request  of:    CC: Elbow pain follow-up  [id number]  Courtney Tran is a 53 y.o. female coming in with complaint of left elbow pain follow-up.  Found to have lateral epicondylitis with an avulsion fracture.  Patient states that she is feeling about 60 to 70% better.  Continues to wear the brace on a fairly regular basis every day.  Feels that that has been helpful.  Patient has been doing the exercises occasionally.  Feels the vitamin D has helped out significantly as well.     Past Medical History:  Diagnosis Date  . Basal cell carcinoma, face 2013   s/p excision, multiple basal cells  . DEPRESSION   . Dyslipidemia   . Esophageal reflux   . Esophageal stricture 08/2005   EGD with dilation   . GALLSTONES   . Obesity, unspecified   . Rosacea   . Seasonal allergies   . Uterine fibroid    Past Surgical History:  Procedure Laterality Date  . ABDOMINAL HYSTERECTOMY  03/06/13  . COLON RESECTION     colon resection when pt was infant  . EXTERNAL EAR SURGERY    . ROBOTIC ASSISTED TOTAL HYSTERECTOMY N/A 03/06/2013   Procedure: ROBOTIC ASSISTED TOTAL HYSTERECTOMY WITH BILATERAL SALPINGECTOMY;  Surgeon: Princess Bruins, MD;  Location: [facility];  Service: Gynecology;  Laterality: N/A;  . Ruptured bowel repair  1967  . WISDOM TOOTH EXTRACTION     Social History   Socioeconomic History  . Marital status: Married    Spouse name: Not on file  . Number of children: Not on file  . Years of education: Not on file  . Highest education level: Not on file  Occupational History  . Not on file  Social Needs  . Financial resource strain: Not on file  . Food insecurity    Worry: Not on file    Inability: Not on file  . Transportation needs    Medical: Not on file    Non-medical: Not on file  Tobacco Use  . Smoking status: Never  Smoker  . Smokeless tobacco: Never Used  Substance and Sexual Activity  . Alcohol use: Yes    Alcohol/week: 0.0 standard drinks    Comment: weekends  . Drug use: No  . Sexual activity: Yes    Partners: Male    Comment: 1st intercourse- 2, partners- 58, married- 23 yrs   Lifestyle  . Physical activity    Days per week: Not on file    Minutes per session: Not on file  . Stress: Not on file  Relationships  . Social Herbalist on phone: Not on file    Gets together: Not on file    Attends religious service: Not on file    Active member of club or organization: Not on file    Attends meetings of clubs or organizations: Not on file    Relationship status: Not on file  Other Topics Concern  . Not on file  Social History Narrative  . Not on file   Allergies  Allergen Reactions  . Contrave [Naltrexone-Bupropion Hcl Er]     Dry mouth  . Lactose Intolerance (Gi)   . Latex Rash   Family History  Problem Relation Age of Onset  . Cirrhosis Mother   . Hyperlipidemia Father   . Hypertension  Father   . Colon polyps Father   . Diabetes Mellitus I Father 19  . Diabetes Maternal Aunt   . Colon cancer Paternal Uncle 16  . Colon polyps Brother   . Colon cancer Paternal Aunt 78    Current Outpatient Medications (Endocrine & Metabolic):  .  estradiol (VIVELLE-DOT) 0.05 MG/24HR patch, Place 1 patch (0.05 mg total) onto the skin 2 (two) times a week.   Current Outpatient Medications (Respiratory):  Marland Kitchen  Fexofenadine HCl (ALLEGRA ALLERGY PO), Take by mouth.   Current Outpatient Medications (Hematological):  .  cyanocobalamin 100 MCG tablet, Take 100 mcg by mouth daily.  Current Outpatient Medications (Other):  .  cholecalciferol (VITAMIN D) 1000 UNITS tablet, Take 1,000 Units by mouth daily. Marland Kitchen  desvenlafaxine (PRISTIQ) 50 MG 24 hr tablet, Take 1 tablet (50 mg total) by mouth daily. .  Diclofenac Sodium (PENNSAID) 2 % SOLN, Place 2 g onto the skin 2 (two) times daily. Marland Kitchen   doxycycline (VIBRA-TABS) 100 MG tablet, Take 1 tablet (100 mg total) by mouth daily. Marland Kitchen  esomeprazole (NEXIUM) 40 MG capsule, Take 1 capsule (40 mg total) by mouth daily before breakfast. .  Prenatal Vit-Fe Fumarate-FA (PRENATAL MULTIVITAMIN) TABS tablet, Take 1 tablet by mouth daily at 12 noon. .  Thiamine HCl (VITAMIN B-1) 250 MG tablet, Take 250 mg by mouth daily. .  Vitamin D, Ergocalciferol, (DRISDOL) 1.25 MG (50000 UT) CAPS capsule, Take 1 capsule (50,000 Units total) by mouth every 7 (seven) days. .  vitamin E 100 UNIT capsule, Take 100 Units by mouth.    Past medical history, social, surgical and family history all reviewed in electronic medical record.  No pertanent information unless stated regarding to the chief complaint.   Review of Systems:  No headache, visual changes, nausea, vomiting, diarrhea, constipation, dizziness, abdominal pain, skin rash, fevers, chills, night sweats, weight loss, swollen lymph nodes, body aches, joint swelling, , chest pain, shortness of breath, mood changes.  Positive muscle aches  Objective  Blood pressure 120/84, pulse 87, resp. rate 15, height 5\' 6"  (1.676 m), weight 216 lb (98 kg), last menstrual period 03/02/2013, SpO2 98 %.    General: No apparent distress alert and oriented x3 mood and affect normal, dressed appropriately.  HEENT: Pupils equal, extraocular movements intact  Respiratory: Patient's speak in full sentences and does not appear short of breath  Cardiovascular: No lower extremity edema, non tender, no erythema  Skin: Warm dry intact with no signs of infection or rash on extremities or on axial skeleton.  Abdomen: Soft nontender  Neuro: Cranial nerves II through XII are intact, neurovascularly intact in all extremities with 2+ DTRs and 2+ pulses.  Lymph: No lymphadenopathy of posterior or anterior cervical chain or axillae bilaterally.  Gait normal with good balance and coordination.  MSK:  Non tender with full range of motion and  good stability and symmetric strength and tone of shoulders,  wrist, hip, knee and ankles bilaterally.  Elbow: Left Unremarkable to inspection. Range of motion full pronation, supination, flexion, extension. Strength is full to all of the above directions Stable to varus, valgus stress. Negative moving valgus stress test. Tender over the lateral epicondylar region Ulnar nerve does not sublux. Negative cubital tunnel Tinel's. Contralateral elbow unremarkable  Musculoskeletal ultrasound was performed and interpreted by Charlann Boxer D.O.   Elbow:  Lateral epicondyle and common extensor tendon origin visualized.  Patient does have a new callus formation over the avulsion fracture noted.  Seems to be improving.  Still not fully across the top.  IMPRESSION: Lateral epicondyle avulsion fracture with interval healing   Impression and Recommendations:     This case required medical decision making of moderate complexity. The above documentation has been reviewed and is accurate and complete Lyndal Pulley, DO       Note: This dictation was prepared with Dragon dictation along with smaller phrase technology. Any transcriptional errors that result from this process are unintentional.

## 2018-10-10 ENCOUNTER — Ambulatory Visit (INDEPENDENT_AMBULATORY_CARE_PROVIDER_SITE_OTHER): Payer: No Typology Code available for payment source | Admitting: Family Medicine

## 2018-10-10 ENCOUNTER — Ambulatory Visit: Payer: Self-pay

## 2018-10-10 ENCOUNTER — Other Ambulatory Visit: Payer: Self-pay

## 2018-10-10 ENCOUNTER — Encounter: Payer: Self-pay | Admitting: Family Medicine

## 2018-10-10 VITALS — BP 120/84 | HR 87 | Resp 15 | Ht 66.0 in | Wt 216.0 lb

## 2018-10-10 DIAGNOSIS — M7712 Lateral epicondylitis, left elbow: Secondary | ICD-10-CM

## 2018-10-10 DIAGNOSIS — M25522 Pain in left elbow: Secondary | ICD-10-CM

## 2018-10-10 MED ORDER — VITAMIN D (ERGOCALCIFEROL) 1.25 MG (50000 UNIT) PO CAPS: 50000.0000 [IU] | ORAL_CAPSULE | ORAL | 0 refills | Status: DC

## 2018-10-10 NOTE — Assessment & Plan Note (Signed)
Callus formation improved.  Continues to have some increase in neovascularization.  This is likely good.  Encouraged range of motion exercises and decreasing the wear of the brace.  Patient declined formal physical therapy with her improving.  Follow-up again in 5 to 6 weeks to make sure patient is fully improved

## 2018-10-10 NOTE — Patient Instructions (Signed)
Great to see you  You are doing great  Wear brace less during the day  Continue the vitamin D See me again in 6 weeks

## 2018-11-09 ENCOUNTER — Other Ambulatory Visit: Payer: Self-pay | Admitting: Obstetrics & Gynecology

## 2018-11-09 DIAGNOSIS — Z1231 Encounter for screening mammogram for malignant neoplasm of breast: Secondary | ICD-10-CM

## 2018-11-21 ENCOUNTER — Encounter: Payer: Self-pay | Admitting: Internal Medicine

## 2018-11-21 NOTE — Progress Notes (Signed)
[facility] [place] [place], [place] [address] Phone: [phone] Subjective:   I Courtney Tran am serving as a Education administrator for Dr. Hulan Saas.  I'm seeing this patient by the request  of:    CC: Left elbow pain follow-up  [id number]   10/10/2018 Callus formation improved.  Continues to have some increase in neovascularization.  This is likely good.  Encouraged range of motion exercises and decreasing the wear of the brace.  Patient declined formal physical therapy with her improving.  Follow-up again in 5 to 6 weeks to make sure patient is fully improved  11/22/2018 Courtney Tran is a 53 y.o. female coming in with complaint of left elbow pain. States that extension is painful. Wearing brace more often and states it feels much better with the brace.  Would state that she feels approximately 60% better than previous 1.     Past Medical History:  Diagnosis Date  . Basal cell carcinoma, face 2013   s/p excision, multiple basal cells  . DEPRESSION   . Dyslipidemia   . Esophageal reflux   . Esophageal stricture 08/2005   EGD with dilation   . GALLSTONES   . Obesity, unspecified   . Rosacea   . Seasonal allergies   . Uterine fibroid    Past Surgical History:  Procedure Laterality Date  . ABDOMINAL HYSTERECTOMY  03/06/13  . COLON RESECTION     colon resection when pt was infant  . EXTERNAL EAR SURGERY    . ROBOTIC ASSISTED TOTAL HYSTERECTOMY N/A 03/06/2013   Procedure: ROBOTIC ASSISTED TOTAL HYSTERECTOMY WITH BILATERAL SALPINGECTOMY;  Surgeon: Princess Bruins, MD;  Location: [facility];  Service: Gynecology;  Laterality: N/A;  . Ruptured bowel repair  1967  . WISDOM TOOTH EXTRACTION     Social History   Socioeconomic History  . Marital status: Married    Spouse name: Not on file  . Number of children: Not on file  . Years of education: Not on file  . Highest education level: Not on file  Occupational History  . Not on file  Social  Needs  . Financial resource strain: Not on file  . Food insecurity    Worry: Not on file    Inability: Not on file  . Transportation needs    Medical: Not on file    Non-medical: Not on file  Tobacco Use  . Smoking status: Never Smoker  . Smokeless tobacco: Never Used  Substance and Sexual Activity  . Alcohol use: Yes    Alcohol/week: 0.0 standard drinks    Comment: weekends  . Drug use: No  . Sexual activity: Yes    Partners: Male    Comment: 1st intercourse- 46, partners- 6, married- 23 yrs   Lifestyle  . Physical activity    Days per week: Not on file    Minutes per session: Not on file  . Stress: Not on file  Relationships  . Social Herbalist on phone: Not on file    Gets together: Not on file    Attends religious service: Not on file    Active member of club or organization: Not on file    Attends meetings of clubs or organizations: Not on file    Relationship status: Not on file  Other Topics Concern  . Not on file  Social History Narrative  . Not on file   Allergies  Allergen Reactions  . Contrave [Naltrexone-Bupropion Hcl Er]  Dry mouth  . Lactose Intolerance (Gi)   . Latex Rash   Family History  Problem Relation Age of Onset  . Cirrhosis Mother   . Hyperlipidemia Father   . Hypertension Father   . Colon polyps Father   . Diabetes Mellitus I Father 33  . Diabetes Maternal Aunt   . Colon cancer Paternal Uncle 49  . Colon polyps Brother   . Colon cancer Paternal Aunt 78    Current Outpatient Medications (Endocrine & Metabolic):  .  estradiol (VIVELLE-DOT) 0.05 MG/24HR patch, Place 1 patch (0.05 mg total) onto the skin 2 (two) times a week.   Current Outpatient Medications (Respiratory):  Marland Kitchen  Fexofenadine HCl (ALLEGRA ALLERGY PO), Take by mouth.   Current Outpatient Medications (Hematological):  .  cyanocobalamin 100 MCG tablet, Take 100 mcg by mouth daily.  Current Outpatient Medications (Other):  .  cholecalciferol (VITAMIN D)  1000 UNITS tablet, Take 1,000 Units by mouth daily. Marland Kitchen  desvenlafaxine (PRISTIQ) 50 MG 24 hr tablet, Take 1 tablet (50 mg total) by mouth daily. .  Diclofenac Sodium (PENNSAID) 2 % SOLN, Place 2 g onto the skin 2 (two) times daily. Marland Kitchen  doxycycline (VIBRA-TABS) 100 MG tablet, Take 1 tablet (100 mg total) by mouth daily. Marland Kitchen  esomeprazole (NEXIUM) 40 MG capsule, Take 1 capsule (40 mg total) by mouth daily before breakfast. .  Prenatal Vit-Fe Fumarate-FA (PRENATAL MULTIVITAMIN) TABS tablet, Take 1 tablet by mouth daily at 12 noon. .  Thiamine HCl (VITAMIN B-1) 250 MG tablet, Take 250 mg by mouth daily. .  Vitamin D, Ergocalciferol, (DRISDOL) 1.25 MG (50000 UT) CAPS capsule, Take 1 capsule (50,000 Units total) by mouth every 7 (seven) days. .  vitamin E 100 UNIT capsule, Take 100 Units by mouth.    Past medical history, social, surgical and family history all reviewed in electronic medical record.  No pertanent information unless stated regarding to the chief complaint.   Review of Systems:  No headache, visual changes, nausea, vomiting, diarrhea, constipation, dizziness, abdominal pain, skin rash, fevers, chills, night sweats, weight loss, swollen lymph nodes, body aches, joint swelling, muscle aches, chest pain, shortness of breath, mood changes.   Objective  Last menstrual period 03/02/2013. Systems examined below as of    General: No apparent distress alert and oriented x3 mood and affect normal, dressed appropriately.  HEENT: Pupils equal, extraocular movements intact  Respiratory: Patient's speak in full sentences and does not appear short of breath  Cardiovascular: No lower extremity edema, non tender, no erythema  Skin: Warm dry intact with no signs of infection or rash on extremities or on axial skeleton.  Abdomen: Soft nontender  Neuro: Cranial nerves II through XII are intact, neurovascularly intact in all extremities with 2+ DTRs and 2+ pulses.  Lymph: No lymphadenopathy of posterior  or anterior cervical chain or axillae bilaterally.  Gait normal with good balance and coordination.  MSK:  Non tender with full range of motion and good stability and symmetric strength and tone of shoulders, wrist, hip, knee and ankles bilaterally.  Elbow: Unremarkable to inspection. Range of motion full pronation, supination, flexion, extension. Strength is full to all of the above directions Stable to varus, valgus stress. Negative moving valgus stress test. No discrete areas of tenderness to palpation. Ulnar nerve does not sublux. Negative cubital tunnel Tinel's.  Musculoskeletal ultrasound was performed and interpreted by Charlann Boxer D.O.   Elbow:  Lateral epicondyle and common extensor tendon seems improved at this time. Radial head unremarkable  and located in annular ligament Medial epicondyle and common flexor tendon origin visualized.  No edema, effusions, or avulsions seen. Ulnar nerve in cubital tunnel unremarkable. Olecranon and triceps insertion visualized and unremarkable without edema, effusion, or avulsion.  No signs olecranon bursitis. Power doppler signal normal.  IMPRESSION: Improvement but continued mild cortical irregularity over the lateral epicondylar region.  Patient's common extensor tendon seems improved   Impression and Recommendations:      The above documentation has been reviewed and is accurate and complete Lyndal Pulley, DO       Note: This dictation was prepared with Dragon dictation along with smaller phrase technology. Any transcriptional errors that result from this process are unintentional.

## 2018-11-22 ENCOUNTER — Ambulatory Visit: Payer: Self-pay

## 2018-11-22 ENCOUNTER — Other Ambulatory Visit: Payer: Self-pay

## 2018-11-22 ENCOUNTER — Ambulatory Visit (INDEPENDENT_AMBULATORY_CARE_PROVIDER_SITE_OTHER): Payer: No Typology Code available for payment source | Admitting: Family Medicine

## 2018-11-22 ENCOUNTER — Encounter: Payer: Self-pay | Admitting: Family Medicine

## 2018-11-22 VITALS — BP 136/90 | HR 77 | Ht 66.0 in | Wt 216.0 lb

## 2018-11-22 DIAGNOSIS — M7712 Lateral epicondylitis, left elbow: Secondary | ICD-10-CM

## 2018-11-22 DIAGNOSIS — M25522 Pain in left elbow: Secondary | ICD-10-CM

## 2018-11-22 NOTE — Patient Instructions (Signed)
Try not to wear the brace during the day Continue exercise and ice during the day See me again in 6 weeks if not perfect

## 2018-11-22 NOTE — Assessment & Plan Note (Signed)
Patient continues to make improvement but it does seem to be slow.  Discussed icing regimen and home exercises, which activities to do which wants to avoid.  Patient is to increase activity slowly over the course the next several weeks.  Follow-up again 4 to 8 weeks

## 2018-12-07 ENCOUNTER — Ambulatory Visit
Admission: RE | Admit: 2018-12-07 | Discharge: 2018-12-07 | Disposition: A | Discharge status: Home / Self Care | Payer: No Typology Code available for payment source | Source: Ambulatory Visit | Attending: Obstetrics & Gynecology | Admitting: Obstetrics & Gynecology

## 2018-12-07 ENCOUNTER — Other Ambulatory Visit: Payer: Self-pay

## 2018-12-07 DIAGNOSIS — Z1231 Encounter for screening mammogram for malignant neoplasm of breast: Secondary | ICD-10-CM

## 2019-01-03 ENCOUNTER — Ambulatory Visit: Payer: No Typology Code available for payment source | Admitting: Family Medicine

## 2019-02-10 ENCOUNTER — Other Ambulatory Visit: Payer: Self-pay | Admitting: Family Medicine

## 2019-03-27 ENCOUNTER — Other Ambulatory Visit: Payer: Self-pay

## 2019-03-28 ENCOUNTER — Ambulatory Visit (INDEPENDENT_AMBULATORY_CARE_PROVIDER_SITE_OTHER): Payer: BC Managed Care – PPO | Admitting: Obstetrics & Gynecology

## 2019-03-28 ENCOUNTER — Encounter: Payer: Self-pay | Admitting: Obstetrics & Gynecology

## 2019-03-28 VITALS — BP 140/90 | Ht 66.0 in | Wt 210.0 lb

## 2019-03-28 DIAGNOSIS — Z90710 Acquired absence of both cervix and uterus: Secondary | ICD-10-CM

## 2019-03-28 DIAGNOSIS — Z780 Asymptomatic menopausal state: Secondary | ICD-10-CM | POA: Diagnosis not present

## 2019-03-28 DIAGNOSIS — Z01419 Encounter for gynecological examination (general) (routine) without abnormal findings: Secondary | ICD-10-CM | POA: Diagnosis not present

## 2019-03-28 DIAGNOSIS — E6609 Other obesity due to excess calories: Secondary | ICD-10-CM | POA: Diagnosis not present

## 2019-03-28 DIAGNOSIS — Z6833 Body mass index (BMI) 33.0-33.9, adult: Secondary | ICD-10-CM

## 2019-03-28 NOTE — Progress Notes (Signed)
Courtney Tran Dec 09, 1965 [id number]   History:    54 y.o.  G0 Married.  Husband is a Research scientist (physical sciences).  Patient is not working.  RP:  Established patient presenting for annual gyn exam   HPI: Status post robotic TLH with bilateral salpingectomy in January 2015. Postmenopause, well on no HRT.  No pelvic pain.  No pain with IC. Pap negative 03/2018.  Urine and bowel movements normal.  Breasts normal. Screening Mammo negative 11/2018.  Body mass index 33.89.  Lost weight x last year with Lower calories/Intermittent fasting and daily walks.  Health labs with family physician.  Colono 2017.  Well healed from a left elbow fracture in 2020.   Past medical history,surgical history, family history and social history were all reviewed and documented in the EPIC chart.  Gynecologic History Patient's last menstrual period was 03/02/2013.  Obstetric History OB History  Gravida Para Term Preterm AB Living  0 0 0 0 0 0  SAB TAB Ectopic Multiple Live Births  0 0 0 0 0     ROS: A ROS was performed and pertinent positives and negatives are included in the history.  GENERAL: No fevers or chills. HEENT: No change in vision, no earache, sore throat or sinus congestion. NECK: No pain or stiffness. CARDIOVASCULAR: No chest pain or pressure. No palpitations. PULMONARY: No shortness of breath, cough or wheeze. GASTROINTESTINAL: No abdominal pain, nausea, vomiting or diarrhea, melena or bright red blood per rectum. GENITOURINARY: No urinary frequency, urgency, hesitancy or dysuria. MUSCULOSKELETAL: No joint or muscle pain, no back pain, no recent trauma. DERMATOLOGIC: No rash, no itching, no lesions. ENDOCRINE: No polyuria, polydipsia, no heat or cold intolerance. No recent change in weight. HEMATOLOGICAL: No anemia or easy bruising or bleeding. NEUROLOGIC: No headache, seizures, numbness, tingling or weakness. PSYCHIATRIC: No depression, no loss of interest in normal activity or change in sleep pattern.      Exam:   BP 140/90   Ht 5\' 6"  (1.676 m)   Wt 210 lb (95.3 kg)   LMP 03/02/2013   BMI 33.89 kg/m   Body mass index is 33.89 kg/m.  General appearance : Well developed well nourished female. No acute distress HEENT: Eyes: no retinal hemorrhage or exudates,  Neck supple, trachea midline, no carotid bruits, no thyroidmegaly Lungs: Clear to auscultation, no rhonchi or wheezes, or rib retractions  Heart: Regular rate and rhythm, no murmurs or gallops Breast:Examined in sitting and supine position were symmetrical in appearance, no palpable masses or tenderness,  no skin retraction, no nipple inversion, no nipple discharge, no skin discoloration, no axillary or supraclavicular lymphadenopathy Abdomen: no palpable masses or tenderness, no rebound or guarding Extremities: no edema or skin discoloration or tenderness  Pelvic: Vulva: Normal             Vagina: No gross lesions or discharge  Cervix/Uterus absent  Adnexa  Without masses or tenderness  Anus: Normal   Assessment/Plan:  54 y.o. female for annual exam   1. Well female exam with routine gynecological exam Gynecologic exam status post total hysterectomy and menopause.  Pap test January 2020 was negative, no indication to repeat this year.  Breast exam normal.  Screening mammogram September 2020 was negative.  Colonoscopy 2017.  Health labs with family physician.  2. S/P total hysterectomy  3. Postmenopause Well on no hormone replacement therapy.  Recommend vitamin D supplements, calcium intake of 1200 mg daily and regular weightbearing physical activities.  4. Class 1 obesity due to excess  calories without serious comorbidity with body mass index (BMI) of 33.0 to 33.9 in adult Good weight loss since last year.  Will continue on a low calorie diet with intermittent fasting.  Continue with daily walks and recommend light weightlifting every 2 days.  Princess Bruins MD, 8:35 AM 03/28/2019

## 2019-03-28 NOTE — Patient Instructions (Signed)
1. Well female exam with routine gynecological exam Gynecologic exam status post total hysterectomy and menopause.  Pap test January 2020 was negative, no indication to repeat this year.  Breast exam normal.  Screening mammogram September 2020 was negative.  Colonoscopy 2017.  Health labs with family physician.  2. S/P total hysterectomy  3. Postmenopause Well on no hormone replacement therapy.  Recommend vitamin D supplements, calcium intake of 1200 mg daily and regular weightbearing physical activities.  4. Class 1 obesity due to excess calories without serious comorbidity with body mass index (BMI) of 33.0 to 33.9 in adult Good weight loss since last year.  Will continue on a low calorie diet with intermittent fasting.  Continue with daily walks and recommend light weightlifting every 2 days.  Courtney Tran, it was a pleasure seeing you today!

## 2019-05-14 ENCOUNTER — Other Ambulatory Visit: Payer: Self-pay | Admitting: Internal Medicine

## 2019-05-14 ENCOUNTER — Other Ambulatory Visit: Payer: Self-pay | Admitting: Family Medicine

## 2019-05-15 ENCOUNTER — Encounter: Payer: Self-pay | Admitting: Internal Medicine

## 2019-05-16 NOTE — Progress Notes (Signed)
Subjective:    Patient ID: Courtney Tran, female    DOB: Jul 20, 1965, 54 y.o.   MRN: [id number]  HPI The patient is here for follow up of their chronic medical problems, including depression, anxiety, rosacea  Depression, anxiety: She is taking her medication daily as prescribed. She denies any side effects from the medication. She feels her depression and anxiety are well controlled and she is happy with her current dose of medication.   Rosacea:  She takes doxycycline daily.  She does see dermatology at least once a year.  She feels the doxycycline is effective in controlling her rosacea.  She would like to continue it.  She is exercising regularly.     Medications and allergies reviewed with patient and updated if appropriate.  Patient Active Problem List   Diagnosis Date Noted  . Left lateral epicondylitis 09/15/2018  . Ganglion cyst of wrist 08/23/2015  . Peroneal tendinitis 12/05/2014  . Dyslipidemia   . Anxiety 10/05/2011  . Obesity 08/21/2008  . Depression 08/21/2008  . Rosacea 08/21/2008  . GALLSTONES 05/04/2008  . ESOPHAGEAL STRICTURE 03/14/2007  . Esophageal reflux 03/14/2007    Current Outpatient Medications on File Prior to Visit  Medication Sig Dispense Refill  . cholecalciferol (VITAMIN D) 1000 UNITS tablet Take 1,000 Units by mouth daily.    . cyanocobalamin 100 MCG tablet Take 100 mcg by mouth daily.    Marland Kitchen esomeprazole (NEXIUM) 40 MG capsule Take 1 capsule (40 mg total) by mouth daily before breakfast. 30 capsule 5  . estradiol (VIVELLE-DOT) 0.05 MG/24HR patch Place 1 patch (0.05 mg total) onto the skin 2 (two) times a week. 24 patch 3  . Fexofenadine HCl (ALLEGRA ALLERGY PO) Take by mouth.    . Prenatal Vit-Fe Fumarate-FA (PRENATAL MULTIVITAMIN) TABS tablet Take 1 tablet by mouth daily at 12 noon.    . Thiamine HCl (VITAMIN B-1) 250 MG tablet Take 250 mg by mouth daily.    . Vitamin D, Ergocalciferol, (DRISDOL) 1.25 MG (50000 UNIT) CAPS capsule TAKE 1  CAPSULE BY MOUTH EVERY 7 DAYS  12 capsule 0  . vitamin E 100 UNIT capsule Take 100 Units by mouth.     No current facility-administered medications on file prior to visit.    Past Medical History:  Diagnosis Date  . Basal cell carcinoma, face 2013   s/p excision, multiple basal cells  . DEPRESSION   . Dyslipidemia   . Esophageal reflux   . Esophageal stricture 08/2005   EGD with dilation   . GALLSTONES   . Obesity, unspecified   . Rosacea   . Seasonal allergies   . Uterine fibroid     Past Surgical History:  Procedure Laterality Date  . ABDOMINAL HYSTERECTOMY  03/06/13  . COLON RESECTION     colon resection when pt was infant  . EXTERNAL EAR SURGERY    . ROBOTIC ASSISTED TOTAL HYSTERECTOMY N/A 03/06/2013   Procedure: ROBOTIC ASSISTED TOTAL HYSTERECTOMY WITH BILATERAL SALPINGECTOMY;  Surgeon: Princess Bruins, MD;  Location: [facility];  Service: Gynecology;  Laterality: N/A;  . Ruptured bowel repair  1967  . WISDOM TOOTH EXTRACTION      Social History   Socioeconomic History  . Marital status: Married    Spouse name: Not on file  . Number of children: Not on file  . Years of education: Not on file  . Highest education level: Not on file  Occupational History  . Not on file  Tobacco Use  . Smoking  status: Never Smoker  . Smokeless tobacco: Never Used  Substance and Sexual Activity  . Alcohol use: Yes    Alcohol/week: 0.0 standard drinks    Comment: weekends  . Drug use: No  . Sexual activity: Yes    Partners: Male    Comment: 1st intercourse- 2, partners- 84, married- 23 yrs   Other Topics Concern  . Not on file  Social History Narrative  . Not on file   Social Determinants of Health   Financial Resource Strain:   . Difficulty of Paying Living Expenses: Not on file  Food Insecurity:   . Worried About Charity fundraiser in the Last Year: Not on file  . Ran Out of Food in the Last Year: Not on file  Transportation Needs:   . Lack of Transportation  (Medical): Not on file  . Lack of Transportation (Non-Medical): Not on file  Physical Activity:   . Days of Exercise per Week: Not on file  . Minutes of Exercise per Session: Not on file  Stress:   . Feeling of Stress : Not on file  Social Connections:   . Frequency of Communication with Friends and Family: Not on file  . Frequency of Social Gatherings with Friends and Family: Not on file  . Attends Religious Services: Not on file  . Active Member of Clubs or Organizations: Not on file  . Attends Archivist Meetings: Not on file  . Marital Status: Not on file    Family History  Problem Relation Age of Onset  . Cirrhosis Mother   . Hyperlipidemia Father   . Hypertension Father   . Colon polyps Father   . Diabetes Mellitus I Father 35  . Diabetes Maternal Aunt   . Colon cancer Paternal Uncle 67  . Colon polyps Brother   . Colon cancer Paternal Aunt 41    Review of Systems  Constitutional: Negative for chills and fever.  HENT: Positive for trouble swallowing.   Respiratory: Negative for cough, shortness of breath and wheezing.   Cardiovascular: Negative for chest pain, palpitations and leg swelling.  Neurological: Negative for light-headedness and headaches.  Psychiatric/Behavioral: Positive for dysphoric mood. Negative for sleep disturbance. The patient is nervous/anxious.        Objective:   Vitals:   05/17/19 0855  BP: 136/84  Pulse: 86  Resp: 16  Temp: 97.9 F (36.6 C)  SpO2: 97%   BP Readings from Last 3 Encounters:  05/17/19 136/84  03/28/19 140/90  11/22/18 136/90   Wt Readings from Last 3 Encounters:  05/17/19 209 lb (94.8 kg)  03/28/19 210 lb (95.3 kg)  11/22/18 216 lb (98 kg)   Body mass index is 33.73 kg/m.   Physical Exam    Constitutional: Appears well-developed and well-nourished. No distress.  HENT:  Head: Normocephalic and atraumatic.  Neck: Neck supple. No tracheal deviation present. No thyromegaly present.  No cervical  lymphadenopathy Cardiovascular: Normal rate, regular rhythm and normal heart sounds.   No murmur heard. No carotid bruit .  No edema Pulmonary/Chest: Effort normal and breath sounds normal. No respiratory distress. No has no wheezes. No rales.  Skin: Skin is warm and dry. Not diaphoretic.  Psychiatric: Normal mood and affect. Behavior is normal.      Assessment & Plan:   Tetanus vaccine today  She will schedule her colonoscopy in the next few months.  Advised following up in 1 year-advised to physical at that time so we can do her routine  blood work  See Problem List for Assessment and Plan of chronic medical problems.    This visit occurred during the SARS-CoV-2 public health emergency.  Safety protocols were in place, including screening questions prior to the visit, additional usage of staff PPE, and extensive cleaning of exam room while observing appropriate contact time as indicated for disinfecting solutions.

## 2019-05-17 ENCOUNTER — Encounter: Payer: Self-pay | Admitting: Internal Medicine

## 2019-05-17 ENCOUNTER — Encounter: Payer: Self-pay | Admitting: Family Medicine

## 2019-05-17 ENCOUNTER — Ambulatory Visit (INDEPENDENT_AMBULATORY_CARE_PROVIDER_SITE_OTHER): Payer: BC Managed Care – PPO | Admitting: Internal Medicine

## 2019-05-17 ENCOUNTER — Other Ambulatory Visit: Payer: Self-pay

## 2019-05-17 VITALS — BP 136/84 | HR 86 | Temp 97.9°F | Resp 16 | Ht 66.0 in | Wt 209.0 lb

## 2019-05-17 DIAGNOSIS — Z23 Encounter for immunization: Secondary | ICD-10-CM

## 2019-05-17 DIAGNOSIS — K222 Esophageal obstruction: Secondary | ICD-10-CM | POA: Diagnosis not present

## 2019-05-17 DIAGNOSIS — F3289 Other specified depressive episodes: Secondary | ICD-10-CM

## 2019-05-17 DIAGNOSIS — F419 Anxiety disorder, unspecified: Secondary | ICD-10-CM | POA: Diagnosis not present

## 2019-05-17 DIAGNOSIS — L719 Rosacea, unspecified: Secondary | ICD-10-CM

## 2019-05-17 MED ORDER — DESVENLAFAXINE SUCCINATE ER 50 MG PO TB24: 50.0000 mg | ORAL_TABLET | Freq: Every day | ORAL | 3 refills | Status: DC

## 2019-05-17 MED ORDER — DOXYCYCLINE HYCLATE 100 MG PO TABS: 100.0000 mg | ORAL_TABLET | Freq: Every day | ORAL | 3 refills | Status: DC

## 2019-05-17 NOTE — Assessment & Plan Note (Signed)
Chronic Controlled, stable Continue current dose of medication-Pristiq 50 mg daily

## 2019-05-17 NOTE — Assessment & Plan Note (Signed)
Chronic Controlled, stable Continue current dose of medication-doxycycline 100 mg daily Sees dermatology at least once a year

## 2019-05-17 NOTE — Patient Instructions (Addendum)
  Tetanus immunization administered today.     Medications reviewed and updated.  Changes include :   none  Your prescription(s) have been submitted to your pharmacy. Please take as directed and contact our office if you believe you are having problem(s) with the medication(s).    Please followup in 1 year

## 2019-05-17 NOTE — Assessment & Plan Note (Signed)
She states she has been experiencing some dysphagia and knows she probably needs to have her esophagus dilated again She plans on calling GI to set up an EGD and will schedule her colonoscopy at that time

## 2019-07-14 ENCOUNTER — Other Ambulatory Visit: Payer: Self-pay | Admitting: Obstetrics & Gynecology

## 2019-08-07 ENCOUNTER — Other Ambulatory Visit: Payer: Self-pay | Admitting: *Deleted

## 2019-08-08 ENCOUNTER — Other Ambulatory Visit: Payer: Self-pay

## 2019-08-08 MED ORDER — ESTRADIOL 0.05 MG/24HR TD PTTW: 1.0000 | MEDICATED_PATCH | TRANSDERMAL | 2 refills | Status: DC

## 2019-08-08 NOTE — Telephone Encounter (Signed)
Patient contacted [place] with My Chart email in "desperate" need for her Estradiol patch.  Dr. Mariah Milling office note from [facility] indicated she was doing well without any HRT and we had denied the refill in March and April.  Patient said she has not been without it and needs refill. Refills sent.

## 2019-08-08 NOTE — Addendum Note (Signed)
Addended by: Ramond Craver on: 08/08/2019 04:54 PM   Modules accepted: Orders

## 2019-08-15 ENCOUNTER — Other Ambulatory Visit: Payer: Self-pay | Admitting: Family Medicine

## 2019-08-28 ENCOUNTER — Encounter: Payer: Self-pay | Admitting: Internal Medicine

## 2019-08-28 ENCOUNTER — Encounter: Payer: Self-pay | Admitting: Family Medicine

## 2019-08-28 DIAGNOSIS — Z1211 Encounter for screening for malignant neoplasm of colon: Secondary | ICD-10-CM

## 2019-08-29 ENCOUNTER — Encounter: Payer: Self-pay | Admitting: Family Medicine

## 2019-08-29 ENCOUNTER — Other Ambulatory Visit: Payer: Self-pay

## 2019-08-29 ENCOUNTER — Ambulatory Visit (INDEPENDENT_AMBULATORY_CARE_PROVIDER_SITE_OTHER): Payer: BC Managed Care – PPO

## 2019-08-29 ENCOUNTER — Ambulatory Visit: Payer: Self-pay

## 2019-08-29 ENCOUNTER — Ambulatory Visit (INDEPENDENT_AMBULATORY_CARE_PROVIDER_SITE_OTHER): Payer: BC Managed Care – PPO | Admitting: Family Medicine

## 2019-08-29 VITALS — BP 128/86 | HR 91 | Ht 66.0 in | Wt 206.8 lb

## 2019-08-29 DIAGNOSIS — M79641 Pain in right hand: Secondary | ICD-10-CM

## 2019-08-29 NOTE — Progress Notes (Signed)
° °  I, [name], LAT, ATC, am serving as scribe for Dr. Lynne Leader.  Courtney Tran is a 54 y.o. female who presents to [place] at [facility] today for R ring finger pain.  She injured her finger last Thursday when she was walking her dogs and her finger got tangled in the leash.  She locates her pain to her R 4th finger DIP.  Swelling: yes  Bruising: yes Aggravating factors: R 4th finger extension and pressure to her DIP joint Treatments tried: buddy taping of R ring finger to R middle finger; ice   Pertinent review of systems: No fevers or chills  Relevant historical information: Esophageal stricture   Exam:  BP 128/86 (BP Location: Left Arm, Patient Position: Sitting, Cuff Size: Large)    Pulse 91    Ht 5\' 6"  (1.676 m)    Wt 206 lb 12.8 oz (93.8 kg)    LMP 03/02/2013    SpO2 97%    BMI 33.38 kg/m  General: Well Developed, well nourished, and in no acute distress.   MSK: Right hand slightly swollen and erythematous at fourth digit DIP otherwise normal-appearing Mildly tender palpation fourth digit DIP. Decreased motion however intact strength to extension and flexion. No severe laxity to ulnar or radial stress. Capillary refill and sensation are intact distally.    Lab and Radiology Results  X-ray images Right fourth digit obtained today personally and independently reviewed Possible tiny fracture at the radial proximal corner of the distal phalanx at DIP.  Seen on AP views not seen on oblique or lateral views.  Await formal radiology review  Diagnostic Limited MSK Ultrasound of: Right fourth DIP Intact flexion and extension tendons. No visible fracture at bony cortex. Small joint effusion present at DIP. Impression: No fracture or tendon injury visible     Assessment and Plan: 54 y.o. female with right fourth digit injury at DIP hand.  No fracture visible on ultrasound.  X-ray shows possible fracture however radiology overread is still  pending. Plan for Stax splint and relative rest.  Also recommend Voltaren gel.  Check back in 4 weeks if not improved.   PDMP not reviewed this encounter. Orders Placed This Encounter  Procedures   [place] LIMITED JOINT SPACE STRUCTURES UP RIGHT(NO LINKED CHARGES)    Order Specific Question:   Reason for Exam (SYMPTOM  OR DIAGNOSIS REQUIRED)    Answer:   R hand pain    Order Specific Question:   Preferred imaging location?    Answer:   [facility]   DG Finger Ring Right    Standing Status:   Future    Standing Expiration Date:   08/28/2020    Order Specific Question:   Reason for Exam (SYMPTOM  OR DIAGNOSIS REQUIRED)    Answer:   eval DIP injury    Order Specific Question:   Is patient pregnant?    Answer:   No    Order Specific Question:   Preferred imaging location?    Answer:   [facility]    Order Specific Question:   Radiology Contrast Protocol - do NOT remove file path    Answer:   \[email or web address]\[email or web address]\DXFluoroContrastProtocols.pdf   No orders of the defined types were placed in this encounter.    Discussed warning signs or symptoms. Please see discharge instructions. Patient expresses understanding.   The above documentation has been reviewed and is accurate and complete Lynne Leader, M.D.

## 2019-08-29 NOTE — Patient Instructions (Signed)
Thank you for coming in today. Get xray today.  Use the STAX splint. You have size 3 now. Courtney Tran be able to go to a sie 2.5 or 2.  Recheck in 4 weeks if not better.  Ok to wean out of the splint if feeling well.  OK to use over the counter voltaren gel on the finger up to 4x daily for pain.   If I see a fracture on xray we will change the plan.

## 2019-08-30 NOTE — Progress Notes (Signed)
Radiology sees a tiny fracture at the corner of the small joint at the finger.  Plan to continue the splint that I gave you which is the right treatment for that.  However we should recheck in 2 weeks.  Schedule follow-up appoint with me in 2 weeks.

## 2019-09-01 ENCOUNTER — Encounter: Payer: Self-pay | Admitting: Internal Medicine

## 2019-09-12 ENCOUNTER — Other Ambulatory Visit: Payer: Self-pay

## 2019-09-12 ENCOUNTER — Ambulatory Visit (INDEPENDENT_AMBULATORY_CARE_PROVIDER_SITE_OTHER): Payer: BC Managed Care – PPO

## 2019-09-12 ENCOUNTER — Ambulatory Visit (INDEPENDENT_AMBULATORY_CARE_PROVIDER_SITE_OTHER): Payer: BC Managed Care – PPO | Admitting: Family Medicine

## 2019-09-12 VITALS — Ht 66.0 in

## 2019-09-12 DIAGNOSIS — S62664D Nondisplaced fracture of distal phalanx of right ring finger, subsequent encounter for fracture with routine healing: Secondary | ICD-10-CM

## 2019-09-12 NOTE — Progress Notes (Signed)
   I, [name], LAT, ATC, am serving as scribe for Dr. Lynne Leader.  Courtney Tran is a 54 y.o. female who presents to [place] at [facility] today for f/u of her R ring finger fx.  She was last seen by Dr. Georgina Snell on 08/29/19 after injuring her R ring finger on 08/24/19 when her hand got tangled in a leash while she was walking her dog.  She was placed in a splint at her last visit.  Since her last visit, pt reports   Diagnostic imaging: R ring finger XR- 08/29/19  Pertinent review of systems: No fevers or chills  Relevant historical information: History ganglion cyst in lateral epicondylitis and depression   Exam:  Ht 5\' 6"  (1.676 m)   LMP 03/02/2013   BMI 33.38 kg/m  General: Well Developed, well nourished, and in no acute distress.   MSK: Right finger wearing Stax splint.  Mildly tender palpation radial aspect of DIP.  Normal strength flexion extension.  No laxity.  Sensation capillary fill are intact distally.    Lab and Radiology Results  X-ray images right fourth digit obtained today personally and independently reviewed.  Tiny fracture at the proximal radial corner of the distal phalanx.  Nondisplaced.  Slight periosteal reabsorption present. Await formal radiology review   Assessment and Plan: 54 y.o. female with right fourth digit fracture about 2 weeks and.  Doing well clinically.  X-ray is stable.  Given the small size of this fracture and stable exam will reassess in about a month.  Continue splint.  Return sooner if needed.   PDMP not reviewed this encounter. Orders Placed This Encounter  Procedures  . DG Finger Ring Right    Standing Status:   Future    Number of Occurrences:   1    Standing Expiration Date:   09/11/2020    Order Specific Question:   Reason for Exam (SYMPTOM  OR DIAGNOSIS REQUIRED)    Answer:   f/u fx    Order Specific Question:   Is patient pregnant?    Answer:   No    Order Specific Question:   Preferred imaging  location?    Answer:   [facility]    Order Specific Question:   Radiology Contrast Protocol - do NOT remove file path    Answer:   \\[email or web address]   No orders of the defined types were placed in this encounter.    Discussed warning signs or symptoms. Please see discharge instructions. Patient expresses understanding.   The above documentation has been reviewed and is accurate and complete Lynne Leader, M.D.

## 2019-09-12 NOTE — Patient Instructions (Signed)
Thank you for coming in today. The fracture looks good.  Ok to recheck in about 1 month.  Continue the splint for 4 weeks or so.  Ok to remove it from time to time as needed.  Let me know if you are having a problem.

## 2019-09-12 NOTE — Progress Notes (Signed)
Fracture shows no change in alignment or displacement. Not all of the healing has happened yet as we discussed in clinic.

## 2019-09-13 ENCOUNTER — Ambulatory Visit: Payer: BC Managed Care – PPO | Admitting: Family Medicine

## 2019-09-18 ENCOUNTER — Encounter: Payer: Self-pay | Admitting: Internal Medicine

## 2019-10-23 ENCOUNTER — Encounter: Payer: Self-pay | Admitting: Internal Medicine

## 2019-10-23 ENCOUNTER — Other Ambulatory Visit: Payer: Self-pay

## 2019-10-23 ENCOUNTER — Ambulatory Visit (AMBULATORY_SURGERY_CENTER): Payer: Self-pay | Admitting: *Deleted

## 2019-10-23 VITALS — Ht 66.0 in | Wt 204.0 lb

## 2019-10-23 DIAGNOSIS — Z86010 Personal history of colonic polyps: Secondary | ICD-10-CM

## 2019-10-23 MED ORDER — PLENVU 140 G PO SOLR: 1.0000 | ORAL | 0 refills | Status: DC

## 2019-10-23 NOTE — Progress Notes (Signed)
cov vax x 2   No egg or soy allergy known to patient  No issues with past sedation with any surgeries or procedures no intubation problems in the past  No FH of Malignant Hyperthermia No diet pills per patient No home 02 use per patient  No blood thinners per patient  Pt denies issues with constipation  No A fib or A flutter  EMMI video to pt or via [place] 19 guidelines implemented in PV today with Pt and RN   Suprep, Plenvu, Sutab not covered by [employer]- Gave Plenvu Sample today in [facility]   Due to the COVID-19 pandemic we are asking patients to follow these guidelines. Please only bring one care partner. Please be aware that your care partner may wait in the car in the parking lot or if they feel like they will be too hot to wait in the car, they may wait in the lobby on the 4th floor. All care partners are required to wear a mask the entire time (we do not have any that we can provide them), they need to practice social distancing, and we will do a Covid check for all patient's and care partners when you arrive. Also we will check their temperature and your temperature. If the care partner waits in their car they need to stay in the parking lot the entire time and we will call them on their cell phone when the patient is ready for discharge so they can bring the car to the front of the building. Also all patient's will need to wear a mask into building.

## 2019-10-31 ENCOUNTER — Other Ambulatory Visit: Payer: Self-pay | Admitting: Obstetrics & Gynecology

## 2019-10-31 DIAGNOSIS — Z1231 Encounter for screening mammogram for malignant neoplasm of breast: Secondary | ICD-10-CM

## 2019-11-06 ENCOUNTER — Other Ambulatory Visit: Payer: Self-pay | Admitting: Family Medicine

## 2019-11-06 ENCOUNTER — Ambulatory Visit (AMBULATORY_SURGERY_CENTER): Payer: BC Managed Care – PPO | Admitting: Internal Medicine

## 2019-11-06 ENCOUNTER — Other Ambulatory Visit: Payer: Self-pay

## 2019-11-06 ENCOUNTER — Encounter: Payer: Self-pay | Admitting: Internal Medicine

## 2019-11-06 VITALS — BP 131/91 | HR 78 | Temp 97.1°F | Resp 13 | Ht 66.0 in | Wt 204.0 lb

## 2019-11-06 DIAGNOSIS — K635 Polyp of colon: Secondary | ICD-10-CM | POA: Diagnosis not present

## 2019-11-06 DIAGNOSIS — D122 Benign neoplasm of ascending colon: Secondary | ICD-10-CM

## 2019-11-06 DIAGNOSIS — Z86010 Personal history of colonic polyps: Secondary | ICD-10-CM | POA: Diagnosis present

## 2019-11-06 MED ORDER — SODIUM CHLORIDE 0.9 % IV SOLN: 500.0000 mL | Freq: Once | INTRAVENOUS | Status: DC

## 2019-11-06 NOTE — Progress Notes (Signed)
Called to room to assist during endoscopic procedure.  Patient ID and intended procedure confirmed with present staff. Received instructions for my participation in the procedure from the performing physician.  

## 2019-11-06 NOTE — Patient Instructions (Signed)
Impression/Recommendations:  Polyp handout given to patient.  Repeat colonoscopy in 5 years for surveillance.  Resume previous diet. Continue present medications. Await pathology results.  If you are having problems swallowing, please contact my office nurse.  YOU HAD AN ENDOSCOPIC PROCEDURE TODAY AT [facility]:   Refer to the procedure report that was given to you for any specific questions about what was found during the examination.  If the procedure report does not answer your questions, please call your gastroenterologist to clarify.  If you requested that your care partner not be given the details of your procedure findings, then the procedure report has been included in a sealed envelope for you to review at your convenience later.  YOU SHOULD EXPECT: Some feelings of bloating in the abdomen. Passage of more gas than usual.  Walking can help get rid of the air that was put into your GI tract during the procedure and reduce the bloating. If you had a lower endoscopy (such as a colonoscopy or flexible sigmoidoscopy) you may notice spotting of blood in your stool or on the toilet paper. If you underwent a bowel prep for your procedure, you may not have a normal bowel movement for a few days.  Please Note:  You might notice some irritation and congestion in your nose or some drainage.  This is from the oxygen used during your procedure.  There is no need for concern and it should clear up in a day or so.  SYMPTOMS TO REPORT IMMEDIATELY:   Following lower endoscopy (colonoscopy or flexible sigmoidoscopy):  Excessive amounts of blood in the stool  Significant tenderness or worsening of abdominal pains  Swelling of the abdomen that is new, acute  Fever of 100F or higher  For urgent or emergent issues, a gastroenterologist can be reached at any hour by calling [phone]. Do not use MyChart messaging for urgent concerns.    DIET:  We do recommend a small meal at  first, but then you may proceed to your regular diet.  Drink plenty of fluids but you should avoid alcoholic beverages for 24 hours.  ACTIVITY:  You should plan to take it easy for the rest of today and you should NOT DRIVE or use heavy machinery until tomorrow (because of the sedation medicines used during the test).    FOLLOW UP: Our staff will call the number listed on your records 48-72 hours following your procedure to check on you and address any questions or concerns that you may have regarding the information given to you following your procedure. If we do not reach you, we will leave a message.  We will attempt to reach you two times.  During this call, we will ask if you have developed any symptoms of COVID 19. If you develop any symptoms (ie: fever, flu-like symptoms, shortness of breath, cough etc.) before then, please call [phone].  If you test positive for Covid 19 in the 2 weeks post procedure, please call and report this information to [place].    If any biopsies were taken you will be contacted by phone or by letter within the next 1-3 weeks.  Please call us at [phone] if you have not heard about the biopsies in 3 weeks.    SIGNATURES/CONFIDENTIALITY: You and/or your care partner have signed paperwork which will be entered into your electronic medical record.  These signatures attest to the fact that that the information above on your After Visit Summary has been reviewed  and is understood.  Full responsibility of the confidentiality of this discharge information lies with you and/or your care-partner. 

## 2019-11-06 NOTE — Op Note (Signed)
[place] Patient Name: Courtney Tran Procedure Date: 11/06/2019 10:56 AM MRN: [id number] Endoscopist: Docia Chuck. Henrene Pastor , MD Age: 54 Referring MD:  Date of Birth: 10-Oct-1965 Gender: Female Account #: [id number] Procedure:                Colonoscopy with cold snare polypectomy x 1 Indications:              High risk colon cancer surveillance: Personal                            history of multiple (3 or more) adenomas. Last                            examination September 2017 Medicines:                Monitored Anesthesia Care Procedure:                Pre-Anesthesia Assessment:                           - Prior to the procedure, a History and Physical                            was performed, and patient medications and                            allergies were reviewed. The patient's tolerance of                            previous anesthesia was also reviewed. The risks                            and benefits of the procedure and the sedation                            options and risks were discussed with the patient.                            All questions were answered, and informed consent                            was obtained. Prior Anticoagulants: The patient has                            taken no previous anticoagulant or antiplatelet                            agents. ASA Grade Assessment: II - A patient with                            mild systemic disease. After reviewing the risks                            and benefits, the patient was deemed in  satisfactory condition to undergo the procedure.                           After obtaining informed consent, the colonoscope                            was passed under direct vision. Throughout the                            procedure, the patient's blood pressure, pulse, and                            oxygen saturations were monitored continuously. The                             Colonoscope was introduced through the anus and                            advanced to the the cecum, identified by                            appendiceal orifice and ileocecal valve. The                            ileocecal valve, appendiceal orifice, and rectum                            were photographed. The quality of the bowel                            preparation was excellent. The colonoscopy was                            performed without difficulty. The patient tolerated                            the procedure well. The bowel preparation used was                            SUPREP via split dose instruction. Scope In: 11:05:32 AM Scope Out: 11:19:40 AM Scope Withdrawal Time: 0 hours 9 minutes 35 seconds  Total Procedure Duration: 0 hours 14 minutes 8 seconds  Findings:                 A 4 mm polyp was found in the ascending colon. The                            polyp was removed with a cold snare. Resection and                            retrieval were complete.                           A diffuse area of moderate melanosis was found in  the entire colon.                           The exam was otherwise without abnormality on                            direct and retroflexion views. Complications:            No immediate complications. Estimated blood loss:                            None. Estimated Blood Loss:     Estimated blood loss: none. Impression:               - One 4 mm polyp in the ascending colon, removed                            with a cold snare. Resected and retrieved.                           - Melanosis in the colon.                           - The examination was otherwise normal on direct                            and retroflexion views. Recommendation:           - Repeat colonoscopy in 5 years for surveillance.                           - Patient has a contact number available for                            emergencies. The  signs and symptoms of potential                            delayed complications were discussed with the                            patient. Return to normal activities tomorrow.                            Written discharge instructions were provided to the                            patient.                           - Resume previous diet.                           - Continue present medications.                           - Await pathology results.                           -  If you are having swallowing problems, as                            previous, please contact my office nurse. We can                            set you up for outpatient endoscopy with esophageal                            dilation as you had in the past. Docia Chuck. Henrene Pastor, MD 11/06/2019 11:26:24 AM This report has been signed electronically.

## 2019-11-06 NOTE — Progress Notes (Signed)
Pt's states no medical or surgical changes since previsit or office visit. 

## 2019-11-06 NOTE — Progress Notes (Signed)
Report to PACU, RN, vss, BBS= Clear.  

## 2019-11-08 ENCOUNTER — Telehealth: Payer: Self-pay | Admitting: *Deleted

## 2019-11-08 NOTE — Telephone Encounter (Signed)
  Follow up Call-  Call back number 11/06/2019  Post procedure Call Back phone  # [phone]  Permission to leave phone message Yes  Some recent data might be hidden     Patient questions:  Do you have a fever, pain , or abdominal swelling? No. Pain Score  0 *  Have you tolerated food without any problems? Yes.    Have you been able to return to your normal activities? Yes.    Do you have any questions about your discharge instructions: Diet   No. Medications  No. Follow up visit  No.  Do you have questions or concerns about your Care? No.  Actions: * If pain score is 4 or above: No action needed, pain <4.  1. Have you developed a fever since your procedure? no  2.   Have you had an respiratory symptoms (SOB or cough) since your procedure? no  3.   Have you tested positive for COVID 19 since your procedure no  4.   Have you had any family members/close contacts diagnosed with the COVID 19 since your procedure?  no   If yes to any of these questions please route to Joylene John, RN and Joella Prince, RN

## 2019-11-08 NOTE — Telephone Encounter (Signed)
°  Follow up Call-  Call back number 11/06/2019  Post procedure Call Back phone  # [phone]  Permission to leave phone message Yes  Some recent data might be hidden     Patient questions:  Message left to call us if necessary.

## 2019-11-14 ENCOUNTER — Encounter: Payer: Self-pay | Admitting: Internal Medicine

## 2019-12-04 ENCOUNTER — Encounter: Payer: Self-pay | Admitting: Family Medicine

## 2019-12-04 ENCOUNTER — Encounter: Payer: Self-pay | Admitting: Internal Medicine

## 2019-12-05 ENCOUNTER — Other Ambulatory Visit: Payer: Self-pay

## 2019-12-05 ENCOUNTER — Ambulatory Visit (INDEPENDENT_AMBULATORY_CARE_PROVIDER_SITE_OTHER): Payer: BC Managed Care – PPO | Admitting: Family Medicine

## 2019-12-05 ENCOUNTER — Encounter: Payer: Self-pay | Admitting: Family Medicine

## 2019-12-05 ENCOUNTER — Ambulatory Visit (INDEPENDENT_AMBULATORY_CARE_PROVIDER_SITE_OTHER): Payer: BC Managed Care – PPO

## 2019-12-05 VITALS — BP 150/90 | HR 90 | Ht 66.0 in | Wt 207.0 lb

## 2019-12-05 DIAGNOSIS — L03011 Cellulitis of right finger: Secondary | ICD-10-CM | POA: Diagnosis not present

## 2019-12-05 DIAGNOSIS — S62664D Nondisplaced fracture of distal phalanx of right ring finger, subsequent encounter for fracture with routine healing: Secondary | ICD-10-CM | POA: Diagnosis not present

## 2019-12-05 MED ORDER — CLINDAMYCIN HCL 300 MG PO CAPS: 300.0000 mg | ORAL_CAPSULE | Freq: Three times a day (TID) | ORAL | 0 refills | Status: DC

## 2019-12-05 NOTE — Progress Notes (Signed)
Rito Ehrlich, am serving as a Education administrator for Dr. Lynne Leader.  Courtney Tran is a 54 y.o. female who presents to [place] at [facility] today for f/u of R ring finger pain and possible re-injury.  She was last seen by Dr. Georgina Snell on 09/12/19 after suffering a closed fracture of her R ring finger distal phalanx while walking her dog.  She was advised to con't wearing her splint x one month and then to gradually wean out of her splint.  Since her last visit, pt reports that her R Ring finger is swollen and painful. States that its on the cuticle closes to her pinky that feels like pins and needles. Patient put the splint back on it just to keep it compressed but feels like it maybe infected. He denies any injury.  Diagnostic imaging: R ring finger XR- 09/12/19; 08/29/19  Pertinent review of systems: No fevers or chills  Relevant historical information: Takes doxycycline 100 mg daily for rosacea.   Exam:  BP (!) 150/90 (BP Location: Left Arm, Patient Position: Sitting, Cuff Size: Normal)    Pulse 90    Ht 5\' 6"  (1.676 m)    Wt 207 lb (93.9 kg)    LMP 03/02/2013    SpO2 97%    BMI 33.41 kg/m  General: Well Developed, well nourished, and in no acute distress.   MSK: Right ring finger radial corner of cuticle with erythema and tenderness. No expressible pus.  No pus was able to be expressed with slight elevation of the cuticle.    Lab and Radiology Results X-ray images right ring finger obtained today personally and independently interpreted Tiny avulsion fracture fragment at radial aspect of the base of the distal phalanx and DIP still present without bony healing. Not significantly changed from prior x-ray in June. Await formal radiology review    Assessment and Plan: 54 y.o. female with right finger paronychia in the setting of a remote fracture. Clinically she was doing just fine until recently I suspect the fracture has clinically healed if not radiographically  healed. I do not think the paronychia has much to do with the old fracture.  As for the paronychia not drainable today. It is just not "ripe" enough. She already takes doxycycline to suppress rosacea. This dose is not really sufficient to treat paronychia. Will use a different unrelated antibiotic in this case clindamycin to try to treat the early paronychia. Also recommend warm water soaks. May need drainage in the future.  As for the fracture clinically doing quite well. I do not think this is going to heal conservatively and I do not think that she is bothered enough by it to proceed with surgery. Watchful waiting going forward.    Orders Placed This Encounter  Procedures   DG Finger Ring Right    Standing Status:   Future    Number of Occurrences:   1    Standing Expiration Date:   12/04/2020    Order Specific Question:   Reason for Exam (SYMPTOM  OR DIAGNOSIS REQUIRED)    Answer:   f/u fx. New paronychia    Order Specific Question:   Is patient pregnant?    Answer:   No    Order Specific Question:   Preferred imaging location?    Answer:   [facility]    Order Specific Question:   Radiology Contrast Protocol - do NOT remove file path    Answer:   [email or web address]  Meds ordered this encounter  Medications   clindamycin (CLEOCIN) 300 MG capsule    Sig: Take 1 capsule (300 mg total) by mouth 3 (three) times daily.    Dispense:  21 capsule    Refill:  0     Discussed warning signs or symptoms. Please see discharge instructions. Patient expresses understanding.   The above documentation has been reviewed and is accurate and complete Lynne Leader, M.D.

## 2019-12-05 NOTE — Progress Notes (Signed)
X-ray finger shows healing fracture

## 2019-12-05 NOTE — Patient Instructions (Signed)
Thank you for coming in today.  I think this is infected.  Continue the doxycycline.  Add clindamycin three times daily.  Warm water soaks to help it drain (if it can drain) may be helpful.  Do not cut on it.   If you get a yeast infection let me know.   Please get an Xray today before you leave    Paronychia Paronychia is an infection of the skin that surrounds a nail. It usually affects the skin around a fingernail, but it may also occur near a toenail. It often causes pain and swelling around the nail. In some cases, a collection of pus (abscess) can form near or under the nail.  This condition may develop suddenly, or it may develop gradually over a longer period. In most cases, paronychia is not serious, and it will clear up with treatment. What are the causes? This condition may be caused by bacteria or a fungus. These germs can enter the body through an opening in the skin, such as a cut or a hangnail. What increases the risk? This condition is more likely to develop in people who:  Get their hands wet often, such as those who work as Designer, industrial/product, bartenders, or nurses.  Bite their fingernails or suck their thumbs.  Trim their nails very short.  Have hangnails or injured fingertips.  Get manicures.  Have diabetes. What are the signs or symptoms? Symptoms of this condition include:  Redness and swelling of the skin near the nail.  Tenderness around the nail when you touch the area.  Pus-filled bumps under the skin at the base and sides of the nail (cuticle).  Fluid or pus under the nail.  Throbbing pain in the area. How is this diagnosed? This condition is diagnosed with a physical exam. In some cases, a sample of pus may be tested to determine what type of bacteria or fungus is causing the condition. How is this treated? Treatment depends on the cause and severity of your condition. If your condition is mild, it may clear up on its own in a few days or after  soaking in warm water. If needed, treatment may include:  Antibiotic medicine, if your infection is caused by bacteria.  Antifungal medicine, if your infection is caused by a fungus.  A procedure to drain pus from an abscess.  Anti-inflammatory medicine (corticosteroids). Follow these instructions at home: Wound care  Keep the affected area clean.  Soak the affected area in warm water, if told to do so by your health care provider. You may be told to do this for 20 minutes, 2-3 times a day.  Keep the area dry when you are not soaking it.  Do not try to drain an abscess yourself.  Follow instructions from your health care provider about how to take care of the affected area. Make sure you: ? Wash your hands with soap and water before you change your bandage (dressing). If soap and water are not available, use hand sanitizer. ? Change your dressing as told by your health care provider.  If you had an abscess drained, check the area every day for signs of infection. Check for: ? Redness, swelling, or pain. ? Fluid or blood. ? Warmth. ? Pus or a bad smell. Medicines   Take over-the-counter and prescription medicines only as told by your health care provider.  If you were prescribed an antibiotic medicine, take it as told by your health care provider. Do not stop taking the antibiotic  even if you start to feel better. General instructions  Avoid contact with harsh chemicals.  Do not pick at the affected area. Prevention  To prevent this condition from happening again: ? Wear rubber gloves when washing dishes or doing other tasks that require your hands to get wet. ? Wear gloves if your hands might come in contact with cleaners or other chemicals. ? Avoid injuring your nails or fingertips. ? Do not bite your nails or tear hangnails. ? Do not cut your nails very short. ? Do not cut your cuticles. ? Use clean nail clippers or scissors when trimming nails. Contact a health  care provider if:  Your symptoms get worse or do not improve with treatment.  You have continued or increased fluid, blood, or pus coming from the affected area.  Your finger or knuckle becomes swollen or difficult to move. Get help right away if you have:  A fever or chills.  Redness spreading away from the affected area.  Joint or muscle pain. Summary  Paronychia is an infection of the skin that surrounds a nail. It often causes pain and swelling around the nail. In some cases, a collection of pus (abscess) can form near or under the nail.  This condition may be caused by bacteria or a fungus. These germs can enter the body through an opening in the skin, such as a cut or a hangnail.  If your condition is mild, it may clear up on its own in a few days. If needed, treatment may include medicine or a procedure to drain pus from an abscess.  To prevent this condition from happening again, wear gloves if doing tasks that require your hands to get wet or to come in contact with chemicals. Also avoid injuring your nails or fingertips. This information is not intended to replace advice given to you by your health care provider. Make sure you discuss any questions you have with your health care provider. Document Revised: 03/19/2017 Document Reviewed: 03/15/2017 Elsevier Patient Education  2020 [employer].

## 2019-12-11 ENCOUNTER — Other Ambulatory Visit: Payer: Self-pay | Admitting: Family Medicine

## 2019-12-11 MED ORDER — CEFDINIR 300 MG PO CAPS: 300.0000 mg | ORAL_CAPSULE | Freq: Two times a day (BID) | ORAL | 0 refills | Status: AC

## 2019-12-12 ENCOUNTER — Ambulatory Visit
Admission: RE | Admit: 2019-12-12 | Discharge: 2019-12-12 | Disposition: A | Discharge status: Home / Self Care | Payer: BC Managed Care – PPO | Source: Ambulatory Visit

## 2019-12-12 ENCOUNTER — Other Ambulatory Visit: Payer: Self-pay

## 2019-12-12 DIAGNOSIS — Z1231 Encounter for screening mammogram for malignant neoplasm of breast: Secondary | ICD-10-CM

## 2019-12-19 MED ORDER — CEFDINIR 300 MG PO CAPS: 300.0000 mg | ORAL_CAPSULE | Freq: Two times a day (BID) | ORAL | 0 refills | Status: AC

## 2019-12-19 NOTE — Addendum Note (Signed)
Addended by: Gregor Hams on: 12/19/2019 01:29 PM   Modules accepted: Orders

## 2020-01-09 ENCOUNTER — Ambulatory Visit
Admission: RE | Admit: 2020-01-09 | Discharge: 2020-01-09 | Disposition: A | Discharge status: Home / Self Care | Payer: BC Managed Care – PPO | Source: Ambulatory Visit | Attending: Obstetrics & Gynecology | Admitting: Obstetrics & Gynecology

## 2020-01-09 ENCOUNTER — Other Ambulatory Visit: Payer: Self-pay

## 2020-02-09 ENCOUNTER — Other Ambulatory Visit: Payer: Self-pay | Admitting: Family Medicine

## 2020-02-10 NOTE — Progress Notes (Signed)
Subjective:    Patient ID: Courtney Tran, female    DOB: 1965/11/04, 54 y.o.   MRN: [id number]  HPI The patient is here for an acute visit to discuss breast reduction and weight loss.  She is seeing Psychiatric nurse and she does need to do a weight loss program that is monitored by her doctor for 3 months.  She would like to lose weight.  She walks the dogs two miles a day.  She feels she knows what she needs to do as far as eating, but it is just a matter of doing it.  She would be interested in medication for short-term if it would help with weight loss.   Medications and allergies reviewed with patient and updated if appropriate.  Patient Active Problem List   Diagnosis Date Noted  . Left lateral epicondylitis 09/15/2018  . Ganglion cyst of wrist 08/23/2015  . Peroneal tendinitis 12/05/2014  . Dyslipidemia   . Anxiety 10/05/2011  . Obesity 08/21/2008  . Depression 08/21/2008  . Rosacea 08/21/2008  . GALLSTONES 05/04/2008  . ESOPHAGEAL STRICTURE 03/14/2007  . Esophageal reflux 03/14/2007    Current Outpatient Medications on File Prior to Visit  Medication Sig Dispense Refill  . cholecalciferol (VITAMIN D) 1000 UNITS tablet Take 1,000 Units by mouth daily.    . clindamycin (CLEOCIN) 300 MG capsule Take 1 capsule (300 mg total) by mouth 3 (three) times daily. 21 capsule 0  . cyanocobalamin 100 MCG tablet Take 100 mcg by mouth daily.    Marland Kitchen desvenlafaxine (PRISTIQ) 50 MG 24 hr tablet Take 1 tablet (50 mg total) by mouth daily. 90 tablet 3  . doxycycline (VIBRA-TABS) 100 MG tablet Take 1 tablet (100 mg total) by mouth daily. 90 tablet 3  . esomeprazole (NEXIUM) 40 MG capsule Take 1 capsule (40 mg total) by mouth daily before breakfast. 30 capsule 5  . estradiol (VIVELLE-DOT) 0.05 MG/24HR patch Place 1 patch (0.05 mg total) onto the skin 2 (two) times a week. 24 patch 2  . Fexofenadine HCl (ALLEGRA ALLERGY PO) Take by mouth.    . Prenatal Vit-Fe Fumarate-FA (PRENATAL  MULTIVITAMIN) TABS tablet Take 1 tablet by mouth daily at 12 noon.    . Thiamine HCl (VITAMIN B-1) 250 MG tablet Take 250 mg by mouth daily.    . Vitamin D, Ergocalciferol, (DRISDOL) 1.25 MG (50000 UNIT) CAPS capsule TAKE ONE CAPSULE BY MOUTH EVERY 7 DAYS 12 capsule 0  . vitamin E 100 UNIT capsule Take 100 Units by mouth.     No current facility-administered medications on file prior to visit.    Past Medical History:  Diagnosis Date  . Allergy   . Basal cell carcinoma, face 2013   s/p excision, multiple basal cells  . DEPRESSION   . Dyslipidemia   . Esophageal reflux   . Esophageal stricture 08/2005   EGD with dilation   . GALLSTONES   . Obesity, unspecified   . Rosacea   . Seasonal allergies   . Uterine fibroid     Past Surgical History:  Procedure Laterality Date  . ABDOMINAL HYSTERECTOMY  03/06/13  . COLON RESECTION     colon resection when pt was infant  . COLONOSCOPY    . EXTERNAL EAR SURGERY    . POLYPECTOMY    . ROBOTIC ASSISTED TOTAL HYSTERECTOMY N/A 03/06/2013   Procedure: ROBOTIC ASSISTED TOTAL HYSTERECTOMY WITH BILATERAL SALPINGECTOMY;  Surgeon: Princess Bruins, MD;  Location: [facility];  Service: Gynecology;  Laterality: N/A;  .  Ruptured bowel repair  1967  . UPPER GASTROINTESTINAL ENDOSCOPY    . WISDOM TOOTH EXTRACTION      Social History   Socioeconomic History  . Marital status: Married    Spouse name: Not on file  . Number of children: Not on file  . Years of education: Not on file  . Highest education level: Not on file  Occupational History  . Not on file  Tobacco Use  . Smoking status: Never Smoker  . Smokeless tobacco: Never Used  Vaping Use  . Vaping Use: Never used  Substance and Sexual Activity  . Alcohol use: Yes    Alcohol/week: 0.0 standard drinks    Comment: weekends  . Drug use: No  . Sexual activity: Yes    Partners: Male    Comment: 1st intercourse- 4, partners- 45, married- 23 yrs   Other Topics Concern  . Not on file    Social History Narrative  . Not on file   Social Determinants of Health   Financial Resource Strain:   . Difficulty of Paying Living Expenses: Not on file  Food Insecurity:   . Worried About Charity fundraiser in the Last Year: Not on file  . Ran Out of Food in the Last Year: Not on file  Transportation Needs:   . Lack of Transportation (Medical): Not on file  . Lack of Transportation (Non-Medical): Not on file  Physical Activity:   . Days of Exercise per Week: Not on file  . Minutes of Exercise per Session: Not on file  Stress:   . Feeling of Stress : Not on file  Social Connections:   . Frequency of Communication with Friends and Family: Not on file  . Frequency of Social Gatherings with Friends and Family: Not on file  . Attends Religious Services: Not on file  . Active Member of Clubs or Organizations: Not on file  . Attends Archivist Meetings: Not on file  . Marital Status: Not on file    Family History  Problem Relation Age of Onset  . Cirrhosis Mother   . Hyperlipidemia Father   . Hypertension Father   . Colon polyps Father   . Diabetes Mellitus I Father 46  . Diabetes Maternal Aunt   . Colon cancer Paternal Uncle 42  . Colon polyps Brother   . Colon cancer Paternal Aunt 78  . Esophageal cancer Neg Hx   . Rectal cancer Neg Hx   . Stomach cancer Neg Hx     Review of Systems     Objective:   Vitals:   02/12/20 0753  BP: (!) 148/82  Pulse: 81  Temp: 98 F (36.7 C)  SpO2: 98%   BP Readings from Last 3 Encounters:  02/12/20 (!) 148/82  12/05/19 (!) 150/90  11/06/19 (!) 131/91   Wt Readings from Last 3 Encounters:  02/12/20 204 lb (92.5 kg)  12/05/19 207 lb (93.9 kg)  11/06/19 204 lb (92.5 kg)   Body mass index is 32.93 kg/m.   Physical Exam         Assessment & Plan:    See Problem List for Assessment and Plan of chronic medical problems.    This visit occurred during the SARS-CoV-2 public health emergency.  Safety  protocols were in place, including screening questions prior to the visit, additional usage of staff PPE, and extensive cleaning of exam room while observing appropriate contact time as indicated for disinfecting solutions.

## 2020-02-12 ENCOUNTER — Other Ambulatory Visit: Payer: Self-pay

## 2020-02-12 ENCOUNTER — Ambulatory Visit (INDEPENDENT_AMBULATORY_CARE_PROVIDER_SITE_OTHER): Payer: BC Managed Care – PPO | Admitting: Internal Medicine

## 2020-02-12 ENCOUNTER — Encounter: Payer: Self-pay | Admitting: Internal Medicine

## 2020-02-12 VITALS — BP 148/82 | HR 81 | Temp 98.0°F | Ht 66.0 in | Wt 204.0 lb

## 2020-02-12 DIAGNOSIS — Z6832 Body mass index (BMI) 32.0-32.9, adult: Secondary | ICD-10-CM

## 2020-02-12 DIAGNOSIS — R030 Elevated blood-pressure reading, without diagnosis of hypertension: Secondary | ICD-10-CM | POA: Diagnosis not present

## 2020-02-12 DIAGNOSIS — E6609 Other obesity due to excess calories: Secondary | ICD-10-CM | POA: Diagnosis not present

## 2020-02-12 MED ORDER — PHENTERMINE HCL 30 MG PO CAPS: 30.0000 mg | ORAL_CAPSULE | ORAL | 0 refills | Status: DC

## 2020-02-12 NOTE — Assessment & Plan Note (Signed)
Acute Blood pressure elevated here today and has been elevated on a couple of other visits elsewhere She does not monitor her blood pressure at home, but can start Discussed that the phentermine can increase her blood pressure and that if her blood pressure is on the high side we may need to discontinue the medication Stressed low-sodium diet, decrease portions, doing regular exercise and weight loss Follow-up in 1 month

## 2020-02-12 NOTE — Telephone Encounter (Signed)
I do not recommend continuing the high of vitamin d because getting too much vitamin d can be dangerous  - she should start taking 1000-2000 units of vitamin d daily ( Ot[facility])

## 2020-02-12 NOTE — Assessment & Plan Note (Signed)
Chronic She is very motivated to lose weight She will continue walking on a regular basis. Discussed the importance of decreasing caloric intake.  Discussed decreasing portions, and decreasing carbohydrates and eliminating sugars She deferred seeing a nutritionist.  She does feel that she knows what she needs to do-she just needs to start doing it Discussed medication options that may help.  She is limited because she does not have insurance coverage for prescriptions at this time and is using good Rx We will try phentermine 30 mg daily.  Discussed possible side effects Her blood pressure is slightly elevated here today and is stressed that she needs to monitor her BP at home-if more than 150/90 we may need to discontinue medication Follow-up in 1 month to recheck weight and blood pressure

## 2020-02-12 NOTE — Patient Instructions (Signed)
  Medications changes include :   Phentermine 30 mg daily in the morning.   Your prescription(s) have been submitted to your pharmacy. Please take as directed and contact our office if you believe you are having problem(s) with the medication(s).    Please followup in 1 month

## 2020-02-22 MED ORDER — ESTRADIOL 0.05 MG/24HR TD PTTW: 1.0000 | MEDICATED_PATCH | TRANSDERMAL | 0 refills | Status: DC

## 2020-03-04 NOTE — Patient Instructions (Addendum)
    Medications changes include :   none  Your prescription(s) have been submitted to your pharmacy. Please take as directed and contact our office if you believe you are having problem(s) with the medication(s).    Please followup in 1 month

## 2020-03-04 NOTE — Progress Notes (Signed)
Subjective:    Patient ID: Courtney Tran, female    DOB: 04/03/1965, 54 y.o.   MRN: [id number]  HPI The patient is here for follow up of her elevated BP and obesity.  She was started on phentermine one month ago.  She is doing a 3 month physician monitored weight loss before she can have her breast reduction.  She has monitored her BP at home.  She is walking and eating healthy.  She is increased how much she is walking.  She is taking the medication as prescribed and denies any side effects.  She denies dry mouth, headaches, palpitations or chest pain.  Her blood pressure has been controlled at home and looks better here today.  She is down 4 pounds.  Over the past few months she has made very subtle changes to her diet and feels she can live with these changes.  She is happy with her weight loss to date and plans on continuing her efforts.  She would like to continue the phentermine.    Medications and allergies reviewed with patient and updated if appropriate.  Patient Active Problem List   Diagnosis Date Noted  . Elevated blood pressure reading 02/12/2020  . Left lateral epicondylitis 09/15/2018  . Ganglion cyst of wrist 08/23/2015  . Peroneal tendinitis 12/05/2014  . Dyslipidemia   . Anxiety 10/05/2011  . Obesity 08/21/2008  . Depression 08/21/2008  . Rosacea 08/21/2008  . GALLSTONES 05/04/2008  . ESOPHAGEAL STRICTURE 03/14/2007  . Esophageal reflux 03/14/2007    Current Outpatient Medications on File Prior to Visit  Medication Sig Dispense Refill  . cholecalciferol (VITAMIN D) 1000 UNITS tablet Take 1,000 Units by mouth daily.    . cyanocobalamin 100 MCG tablet Take 100 mcg by mouth daily.    Marland Kitchen desvenlafaxine (PRISTIQ) 50 MG 24 hr tablet Take 1 tablet (50 mg total) by mouth daily. 90 tablet 3  . doxycycline (VIBRA-TABS) 100 MG tablet Take 1 tablet (100 mg total) by mouth daily. 90 tablet 3  . esomeprazole (NEXIUM) 40 MG capsule Take 1 capsule (40 mg total) by  mouth daily before breakfast. 30 capsule 5  . estradiol (VIVELLE-DOT) 0.05 MG/24HR patch Place 1 patch (0.05 mg total) onto the skin 2 (two) times a week. 24 patch 0  . Fexofenadine HCl (ALLEGRA ALLERGY PO) Take by mouth.    . phentermine 30 MG capsule Take 1 capsule (30 mg total) by mouth every morning. 30 capsule 0  . Prenatal Vit-Fe Fumarate-FA (PRENATAL MULTIVITAMIN) TABS tablet Take 1 tablet by mouth daily at 12 noon.    . Thiamine HCl (VITAMIN B-1) 250 MG tablet Take 250 mg by mouth daily.    . vitamin E 100 UNIT capsule Take 100 Units by mouth.     No current facility-administered medications on file prior to visit.    Past Medical History:  Diagnosis Date  . Allergy   . Basal cell carcinoma, face 2013   s/p excision, multiple basal cells  . DEPRESSION   . Dyslipidemia   . Esophageal reflux   . Esophageal stricture 08/2005   EGD with dilation   . GALLSTONES   . Obesity, unspecified   . Rosacea   . Seasonal allergies   . Uterine fibroid     Past Surgical History:  Procedure Laterality Date  . ABDOMINAL HYSTERECTOMY  03/06/13  . COLON RESECTION     colon resection when pt was infant  . COLONOSCOPY    . EXTERNAL EAR SURGERY    .  POLYPECTOMY    . ROBOTIC ASSISTED TOTAL HYSTERECTOMY N/A 03/06/2013   Procedure: ROBOTIC ASSISTED TOTAL HYSTERECTOMY WITH BILATERAL SALPINGECTOMY;  Surgeon: Princess Bruins, MD;  Location: [facility];  Service: Gynecology;  Laterality: N/A;  . Ruptured bowel repair  1967  . UPPER GASTROINTESTINAL ENDOSCOPY    . WISDOM TOOTH EXTRACTION      Social History   Socioeconomic History  . Marital status: Married    Spouse name: Not on file  . Number of children: Not on file  . Years of education: Not on file  . Highest education level: Not on file  Occupational History  . Not on file  Tobacco Use  . Smoking status: Never Smoker  . Smokeless tobacco: Never Used  Vaping Use  . Vaping Use: Never used  Substance and Sexual Activity  . Alcohol  use: Yes    Alcohol/week: 0.0 standard drinks    Comment: weekends  . Drug use: No  . Sexual activity: Yes    Partners: Male    Comment: 1st intercourse- 18, partners- 61, married- 23 yrs   Other Topics Concern  . Not on file  Social History Narrative  . Not on file   Social Determinants of Health   Financial Resource Strain: Not on file  Food Insecurity: Not on file  Transportation Needs: Not on file  Physical Activity: Not on file  Stress: Not on file  Social Connections: Not on file    Family History  Problem Relation Age of Onset  . Cirrhosis Mother   . Hyperlipidemia Father   . Hypertension Father   . Colon polyps Father   . Diabetes Mellitus I Father 35  . Diabetes Maternal Aunt   . Colon cancer Paternal Uncle 24  . Colon polyps Brother   . Colon cancer Paternal Aunt 78  . Esophageal cancer Neg Hx   . Rectal cancer Neg Hx   . Stomach cancer Neg Hx     Review of Systems  Constitutional: Negative for fever.  HENT:       No dry mouth  Cardiovascular: Negative for chest pain and palpitations.  Neurological: Negative for light-headedness and headaches.  Psychiatric/Behavioral: Negative for sleep disturbance.       Objective:   Vitals:   03/05/20 0754  BP: 132/88  Pulse: 78  Temp: 98 F (36.7 C)  SpO2: 99%   BP Readings from Last 3 Encounters:  03/05/20 132/88  02/12/20 (!) 148/82  12/05/19 (!) 150/90   Wt Readings from Last 3 Encounters:  03/05/20 200 lb 12.8 oz (91.1 kg)  02/12/20 204 lb (92.5 kg)  12/05/19 207 lb (93.9 kg)   Body mass index is 32.41 kg/m.   Physical Exam         Assessment & Plan:    See Problem List for Assessment and Plan of chronic medical problems.    This visit occurred during the SARS-CoV-2 public health emergency.  Safety protocols were in place, including screening questions prior to the visit, additional usage of staff PPE, and extensive cleaning of exam room while observing appropriate contact time as  indicated for disinfecting solutions.

## 2020-03-05 ENCOUNTER — Ambulatory Visit (INDEPENDENT_AMBULATORY_CARE_PROVIDER_SITE_OTHER): Payer: BC Managed Care – PPO | Admitting: Internal Medicine

## 2020-03-05 ENCOUNTER — Encounter: Payer: Self-pay | Admitting: Internal Medicine

## 2020-03-05 ENCOUNTER — Other Ambulatory Visit: Payer: Self-pay

## 2020-03-05 VITALS — BP 132/88 | HR 78 | Temp 98.0°F | Ht 66.0 in | Wt 200.8 lb

## 2020-03-05 DIAGNOSIS — R030 Elevated blood-pressure reading, without diagnosis of hypertension: Secondary | ICD-10-CM | POA: Diagnosis not present

## 2020-03-05 DIAGNOSIS — Z6832 Body mass index (BMI) 32.0-32.9, adult: Secondary | ICD-10-CM | POA: Diagnosis not present

## 2020-03-05 DIAGNOSIS — E6609 Other obesity due to excess calories: Secondary | ICD-10-CM | POA: Diagnosis not present

## 2020-03-05 MED ORDER — PHENTERMINE HCL 30 MG PO CAPS
30.0000 mg | ORAL_CAPSULE | ORAL | 1 refills | Status: DC
Start: 2020-03-05 — End: 2020-04-05

## 2020-03-05 NOTE — Assessment & Plan Note (Signed)
Chronic Started on phentermine 30 mg daily 3 weeks ago and has lost 4 pounds.  She denies side effects from medication and is tolerating it well She is exercising regularly and has increased her exercise She is almost completely eliminated alcohol and is not eating any sugars.  She has decreased her portions  We will continue phentermine 30 mg daily Continue regular walking Continue small portions, low in sugar and minimal alcohol Follow-up virtually in 1 month

## 2020-03-05 NOTE — Assessment & Plan Note (Signed)
Blood pressure looks better today and is borderline elevated, but not elevated No need for treatment at this time We will continue to monitor, especially since she is on the phentermine She will continue to monitor her BP at home

## 2020-03-08 ENCOUNTER — Encounter: Payer: Self-pay | Admitting: Family Medicine

## 2020-03-09 ENCOUNTER — Other Ambulatory Visit: Payer: Self-pay | Admitting: Family Medicine

## 2020-03-09 ENCOUNTER — Encounter: Payer: Self-pay | Admitting: Family Medicine

## 2020-03-11 MED ORDER — CEFDINIR 300 MG PO CAPS: 300.0000 mg | ORAL_CAPSULE | Freq: Two times a day (BID) | ORAL | 0 refills | Status: DC

## 2020-03-12 NOTE — Telephone Encounter (Signed)
Omnicef refilled yesterday.

## 2020-03-19 ENCOUNTER — Other Ambulatory Visit: Payer: Self-pay | Admitting: Family Medicine

## 2020-03-19 NOTE — Telephone Encounter (Signed)
Please advise 

## 2020-03-19 NOTE — Telephone Encounter (Signed)
I called Courtney Tran back about the [facility] refill request.  She states that her finger has improved significantly but is still a little bit red and tender.  She would like to extend the antibiotic course by a bit longer.  I think this is reasonable and have refilled the Omnicef.  However if this is not improving we probably should see each other again.

## 2020-03-21 ENCOUNTER — Encounter: Payer: Self-pay | Admitting: Family Medicine

## 2020-03-22 NOTE — Progress Notes (Unsigned)
I, Courtney Tran, LAT, ATC, am serving as scribe for Dr. Lynne Leader.  Courtney Tran is a 55 y.o. female who presents to [place] at [facility] today for f/u of R ring finger pain that initially began after suffering a closed fx of her R ring finger distal phalanx in June 2021.  She then began having issues w/ paronychia in that same finger along her cuticle and was prescribed clindamycin in September and more recently cefdinir [name]) on 03/19/20.  Since her last visit w/ Dr. Georgina Snell, pt reports that her R ring finger began looking red and irritated along her cuticle on Mar 14, 2020.  She has been taking her most recent antibiotic.  She states that her finger looks a little better since taking the antibiotic but is still slightly red.  She takes doxycycline regularly for acne.  Diagnostic testing: R ring finger XR- 12/05/19, 09/12/19    Pertinent review of systems: No fevers or chills  Relevant historical information: History of esophageal stricture   Exam:  BP 140/88 (BP Location: Left Arm, Patient Position: Sitting, Cuff Size: Normal)   Pulse 86   Ht 5\' 6"  (1.676 m)   Wt 204 lb 9.6 oz (92.8 kg)   LMP 03/02/2013   SpO2 98%   BMI 33.02 kg/m  General: Well Developed, well nourished, and in no acute distress.   MSK: Right fourth digit slight erythema and swelling around the cuticle of the nail.  Otherwise finger and joint normal-appearing and Normal motion DIP. Pulses cap refill and sensation are intact distally.    Lab and Radiology Results X-ray images right fourth digit obtained today personally and independently interpreted. Faint lucency at the radial base of the distal phalanx at DIP.  This is the area of prior fracture.  No obvious continued fracture. Await formal radiology review    Assessment and Plan: 55 y.o. female with persistent paronychia right fourth digit somewhat remotely following a fracture occurring about half a year ago.  Patient has  good response to antibiotics but the swelling and redness tends to recur after the antibiotics wear off after a while.  Not sure why this is.  She is using Neosporin so advised her to stop that.  One possibility is that she has a osteomyelitis that were able to suppress but hopefully treat with oral antibiotics.  Plan for repeat x-ray today.  We will switch antibiotics to Bactrim when she is not been on before for this.  If this recurs next step would be MRI to evaluate for osteomyelitis.  Gave patient specific precautions for Katherina Right syndrome with Bactrim.   PDMP not reviewed this encounter. Orders Placed This Encounter  Procedures  . DG Finger Ring Right    Standing Status:   Future    Number of Occurrences:   1    Standing Expiration Date:   03/25/2021    Order Specific Question:   Reason for Exam (SYMPTOM  OR DIAGNOSIS REQUIRED)    Answer:   eval fx and poss osteomyelitis    Order Specific Question:   Is patient pregnant?    Answer:   No    Order Specific Question:   Preferred imaging location?    Answer:   [facility]   Meds ordered this encounter  Medications  . sulfamethoxazole-trimethoprim (BACTRIM DS) 800-160 MG tablet    Sig: Take 1 tablet by mouth 2 (two) times daily.    Dispense:  28 tablet    Refill:  0     Discussed warning signs or symptoms. Please see discharge instructions. Patient expresses understanding.   The above documentation has been reviewed and is accurate and complete Lynne Leader, M.D.

## 2020-03-25 ENCOUNTER — Ambulatory Visit (INDEPENDENT_AMBULATORY_CARE_PROVIDER_SITE_OTHER): Payer: BC Managed Care – PPO

## 2020-03-25 ENCOUNTER — Other Ambulatory Visit: Payer: Self-pay

## 2020-03-25 ENCOUNTER — Encounter: Payer: Self-pay | Admitting: Family Medicine

## 2020-03-25 ENCOUNTER — Ambulatory Visit (INDEPENDENT_AMBULATORY_CARE_PROVIDER_SITE_OTHER): Payer: BC Managed Care – PPO | Admitting: Family Medicine

## 2020-03-25 VITALS — BP 140/88 | HR 86 | Ht 66.0 in | Wt 204.6 lb

## 2020-03-25 DIAGNOSIS — S62664D Nondisplaced fracture of distal phalanx of right ring finger, subsequent encounter for fracture with routine healing: Secondary | ICD-10-CM

## 2020-03-25 DIAGNOSIS — L03011 Cellulitis of right finger: Secondary | ICD-10-CM

## 2020-03-25 MED ORDER — SULFAMETHOXAZOLE-TRIMETHOPRIM 800-160 MG PO TABS: 1.0000 | ORAL_TABLET | Freq: Two times a day (BID) | ORAL | 0 refills | Status: DC

## 2020-03-25 NOTE — Progress Notes (Signed)
X-ray shows that the fracture has healed.  No evidence of osteomyelitis on x-ray.  However x-ray can sometimes miss this.  If the infection returns next step is MRI.

## 2020-03-25 NOTE — Patient Instructions (Signed)
Thank you for coming in today.  Please get an Xray today before you leave  STOP omnicef  Start bactrim.   If the infection returns let me know.  We will probably get an MRI.   I am looking for osteomyelitis.   Stop using neosporin.    Let me know if you get a significant rash over a lot of your body.

## 2020-03-26 ENCOUNTER — Encounter: Payer: Self-pay | Admitting: Family Medicine

## 2020-03-28 ENCOUNTER — Other Ambulatory Visit: Payer: Self-pay

## 2020-03-28 ENCOUNTER — Ambulatory Visit (INDEPENDENT_AMBULATORY_CARE_PROVIDER_SITE_OTHER): Payer: BC Managed Care – PPO | Admitting: Obstetrics & Gynecology

## 2020-03-28 ENCOUNTER — Encounter: Payer: Self-pay | Admitting: Obstetrics & Gynecology

## 2020-03-28 VITALS — BP 126/84 | Ht 65.5 in | Wt 200.0 lb

## 2020-03-28 DIAGNOSIS — E6609 Other obesity due to excess calories: Secondary | ICD-10-CM | POA: Diagnosis not present

## 2020-03-28 DIAGNOSIS — Z90710 Acquired absence of both cervix and uterus: Secondary | ICD-10-CM | POA: Diagnosis not present

## 2020-03-28 DIAGNOSIS — Z6832 Body mass index (BMI) 32.0-32.9, adult: Secondary | ICD-10-CM

## 2020-03-28 DIAGNOSIS — Z79890 Hormone replacement therapy (postmenopausal): Secondary | ICD-10-CM

## 2020-03-28 DIAGNOSIS — Z01419 Encounter for gynecological examination (general) (routine) without abnormal findings: Secondary | ICD-10-CM | POA: Diagnosis not present

## 2020-03-28 MED ORDER — ESTRADIOL 0.05 MG/24HR TD PTTW
1.0000 | MEDICATED_PATCH | TRANSDERMAL | 4 refills | Status: DC
Start: 2020-03-28 — End: 2021-04-01

## 2020-03-28 NOTE — Progress Notes (Signed)
TANISHA LUTES December 01, 1965 [id number]   History:    55 y.o. G0 Married.  Husband is a Research scientist (physical sciences).  Patient is not working.  ID:[id number] presenting for annual gyn exam   QQP:YPPJKD post robotic TLH with bilateral salpingectomy in January 2015. Postmenopause, well on Estradiol patch 0.05 twice weekly. No pelvic pain.  No pain with IC. Pap negative1/2020.  Urine and bowel movements normal. Breasts normal. Screening Mammo negative 12/2019. Body mass index 32.78. Lost weight x last year with Lower calories/Intermittent fasting and daily walks. Health labs with family physician.  Colono 2021 Benign polyps.  Fam h/o Colon Ca.     Past medical history,surgical history, family history and social history were all reviewed and documented in the EPIC chart.  Gynecologic History Patient's last menstrual period was 03/02/2013.  Obstetric History OB History  Gravida Para Term Preterm AB Living  0 0 0 0 0 0  SAB IAB Ectopic Multiple Live Births  0 0 0 0 0     ROS: A ROS was performed and pertinent positives and negatives are included in the history.  GENERAL: No fevers or chills. HEENT: No change in vision, no earache, sore throat or sinus congestion. NECK: No pain or stiffness. CARDIOVASCULAR: No chest pain or pressure. No palpitations. PULMONARY: No shortness of breath, cough or wheeze. GASTROINTESTINAL: No abdominal pain, nausea, vomiting or diarrhea, melena or bright red blood per rectum. GENITOURINARY: No urinary frequency, urgency, hesitancy or dysuria. MUSCULOSKELETAL: No joint or muscle pain, no back pain, no recent trauma. DERMATOLOGIC: No rash, no itching, no lesions. ENDOCRINE: No polyuria, polydipsia, no heat or cold intolerance. No recent change in weight. HEMATOLOGICAL: No anemia or easy bruising or bleeding. NEUROLOGIC: No headache, seizures, numbness, tingling or weakness. PSYCHIATRIC: No depression, no loss of interest in normal activity or change in sleep pattern.      Exam:   BP 126/84   Ht 5' 5.5" (1.664 m)   Wt 200 lb (90.7 kg)   LMP 03/02/2013   BMI 32.78 kg/m   Body mass index is 32.78 kg/m.  General appearance : Well developed well nourished female. No acute distress HEENT: Eyes: no retinal hemorrhage or exudates,  Neck supple, trachea midline, no carotid bruits, no thyroidmegaly Lungs: Clear to auscultation, no rhonchi or wheezes, or rib retractions  Heart: Regular rate and rhythm, no murmurs or gallops Breast:Examined in sitting and supine position were symmetrical in appearance, no palpable masses or tenderness,  no skin retraction, no nipple inversion, no nipple discharge, no skin discoloration, no axillary or supraclavicular lymphadenopathy Abdomen: no palpable masses or tenderness, no rebound or guarding Extremities: no edema or skin discoloration or tenderness  Pelvic: Vulva: Normal             Vagina: No gross lesions or discharge  Cervix/Uterus absent  Adnexa  Without masses or tenderness  Anus: Normal   Assessment/Plan:  55 y.o. female for annual exam   1. Well female exam with routine gynecological exam Gynecologic exam status post total hysterectomy.  No indication to repeat a Pap test this year, Pap test negative in 2020.  Breast exam normal.  Screening mammogram October 2021 was negative.  Colonoscopy 2021.  Health labs with family physician.  2. S/P total hysterectomy  3. Postmenopausal hormone replacement therapy Well on Estradiol patch 0.05 twice a week.  Occasional mild hot flushes.  Will call back to adjust treatment if they become worse.  No contraindication to continue on estradiol therapy.  Prescription sent to  pharmacy.  4. Class 1 obesity due to excess calories without serious comorbidity with body mass index (BMI) of 32.0 to 32.9 in adult Progressive loss of weight x2 years.  Continue on a low calorie/carb diet.  Aerobic activities 5 times a week and light weightlifting every 2 days.  Other orders -  estradiol (VIVELLE-DOT) 0.05 MG/24HR patch; Place 1 patch (0.05 mg total) onto the skin 2 (two) times a week.  Princess Bruins MD, 8:59 AM 03/28/2020

## 2020-04-04 NOTE — Progress Notes (Signed)
Virtual Visit via Video Note  I connected with Courtney Tran on 04/04/20 at  7:45 AM EST by a video enabled telemedicine application and verified that I am speaking with the correct person using two identifiers.   I discussed the limitations of evaluation and management by telemedicine and the availability of in person appointments. The patient expressed understanding and agreed to proceed.  Present for the visit:  Myself, Dr Billey Gosling, Dia Sitter.  The patient is currently at home and I am in the office.    No referring provider.    History of Present Illness: This visit is for follow up.  Obesity - she is taking phentermine 30 mg daily and has been on this for almost 2 months.  She started at 27.  She was 197 lb today at home.    She tolerates the medication well.  She has no side effects - no chest pain, palps, headaches.  She has more energy.  She is exercising regularly - walking twice a day.   She continues to drink minimal alcohol and sugars.  She is eating less.       Elevated BP - her BP has been elevated.  She is monitoring her BP at home.  It has been good at home.     Social History   Socioeconomic History   Marital status: Married    Spouse name: Not on file   Number of children: Not on file   Years of education: Not on file   Highest education level: Not on file  Occupational History   Not on file  Tobacco Use   Smoking status: Never Smoker   Smokeless tobacco: Never Used  Vaping Use   Vaping Use: Never used  Substance and Sexual Activity   Alcohol use: Yes    Alcohol/week: 0.0 standard drinks    Comment: weekends   Drug use: No   Sexual activity: Yes    Partners: Male    Comment: 1st intercourse- 26, partners- 25, married- 23 yrs   Other Topics Concern   Not on file  Social History Narrative   Not on file   Social Determinants of Health   Financial Resource Strain: Not on file  Food Insecurity: Not on file  Transportation  Needs: Not on file  Physical Activity: Not on file  Stress: Not on file  Social Connections: Not on file     Observations/Objective: Appears well in NAD Breathing normally  Assessment and Plan:  See Problem List for Assessment and Plan of chronic medical problems.   Follow Up Instructions:    I discussed the assessment and treatment plan with the patient. The patient was provided an opportunity to ask questions and all were answered. The patient agreed with the plan and demonstrated an understanding of the instructions.   The patient was advised to call back or seek an in-person evaluation if the symptoms worsen or if the condition fails to improve as anticipated.    Binnie Rail, MD

## 2020-04-05 ENCOUNTER — Telehealth (INDEPENDENT_AMBULATORY_CARE_PROVIDER_SITE_OTHER): Payer: BC Managed Care – PPO | Admitting: Internal Medicine

## 2020-04-05 ENCOUNTER — Encounter: Payer: Self-pay | Admitting: Internal Medicine

## 2020-04-05 DIAGNOSIS — E6609 Other obesity due to excess calories: Secondary | ICD-10-CM

## 2020-04-05 DIAGNOSIS — R030 Elevated blood-pressure reading, without diagnosis of hypertension: Secondary | ICD-10-CM | POA: Diagnosis not present

## 2020-04-05 DIAGNOSIS — Z6832 Body mass index (BMI) 32.0-32.9, adult: Secondary | ICD-10-CM

## 2020-04-05 MED ORDER — PHENTERMINE HCL 30 MG PO CAPS
30.0000 mg | ORAL_CAPSULE | ORAL | 1 refills | Status: DC
Start: 2020-04-05 — End: 2020-08-14

## 2020-04-05 NOTE — Assessment & Plan Note (Signed)
Chronic Doing well with the medication, eating healthy, smaller portions, limiting alcohol and exercising Continue phentermine 30 mg daily Will continue for 2-3 more months - she knows this is a temporary medication

## 2020-04-05 NOTE — Assessment & Plan Note (Signed)
She is monitoring her BP at home and it has been good   Not elevated with medication Continue to monitor periodically at home

## 2020-04-20 ENCOUNTER — Encounter: Payer: Self-pay | Admitting: Family Medicine

## 2020-04-20 DIAGNOSIS — L03011 Cellulitis of right finger: Secondary | ICD-10-CM

## 2020-05-01 NOTE — Addendum Note (Signed)
Addended by: Gregor Hams on: 05/01/2020 08:22 AM   Modules accepted: Orders

## 2020-05-17 ENCOUNTER — Encounter: Payer: Self-pay | Admitting: Internal Medicine

## 2020-05-17 MED ORDER — DESVENLAFAXINE SUCCINATE ER 50 MG PO TB24
50.0000 mg | ORAL_TABLET | Freq: Every day | ORAL | 1 refills | Status: DC
Start: 2020-05-17 — End: 2020-08-14

## 2020-05-17 MED ORDER — DOXYCYCLINE HYCLATE 100 MG PO TABS
100.0000 mg | ORAL_TABLET | Freq: Every day | ORAL | 3 refills | Status: DC
Start: 2020-05-17 — End: 2020-08-14

## 2020-08-12 ENCOUNTER — Encounter: Payer: Self-pay | Admitting: Internal Medicine

## 2020-08-14 MED ORDER — PHENTERMINE HCL 30 MG PO CAPS
30.0000 mg | ORAL_CAPSULE | ORAL | 0 refills | Status: DC
Start: 2020-08-14 — End: 2021-01-02

## 2020-08-14 MED ORDER — DOXYCYCLINE HYCLATE 100 MG PO TABS
100.0000 mg | ORAL_TABLET | Freq: Every day | ORAL | 3 refills | Status: DC
Start: 2020-08-14 — End: 2021-01-03

## 2020-08-14 MED ORDER — DESVENLAFAXINE SUCCINATE ER 50 MG PO TB24
50.0000 mg | ORAL_TABLET | Freq: Every day | ORAL | 1 refills | Status: DC
Start: 2020-08-14 — End: 2021-05-05

## 2020-10-03 DIAGNOSIS — M25511 Pain in right shoulder: Secondary | ICD-10-CM | POA: Insufficient documentation

## 2020-10-03 DIAGNOSIS — G8929 Other chronic pain: Secondary | ICD-10-CM | POA: Insufficient documentation

## 2020-10-03 DIAGNOSIS — M546 Pain in thoracic spine: Secondary | ICD-10-CM | POA: Insufficient documentation

## 2020-10-03 DIAGNOSIS — N62 Hypertrophy of breast: Secondary | ICD-10-CM | POA: Insufficient documentation

## 2020-10-03 DIAGNOSIS — M542 Cervicalgia: Secondary | ICD-10-CM | POA: Insufficient documentation

## 2020-10-03 DIAGNOSIS — N6481 Ptosis of breast: Secondary | ICD-10-CM | POA: Insufficient documentation

## 2020-10-09 ENCOUNTER — Encounter: Payer: Self-pay | Admitting: Internal Medicine

## 2020-10-18 ENCOUNTER — Telehealth (INDEPENDENT_AMBULATORY_CARE_PROVIDER_SITE_OTHER): Payer: BC Managed Care – PPO | Admitting: Internal Medicine

## 2020-10-18 ENCOUNTER — Encounter: Payer: Self-pay | Admitting: Internal Medicine

## 2020-10-18 DIAGNOSIS — U071 COVID-19: Secondary | ICD-10-CM

## 2020-10-18 NOTE — Progress Notes (Signed)
Virtual Visit via Video Note  I connected with Courtney Tran on 10/18/20 at  1:40 PM EDT by a video enabled telemedicine application and verified that I am speaking with the correct person using two identifiers.   I discussed the limitations of evaluation and management by telemedicine and the availability of in person appointments. The patient expressed understanding and agreed to proceed.  Present for the visit:  Myself, Dr Billey Gosling, Dia Sitter.  The patient is currently at home and I am in the office.    No referring provider.    History of Present Illness: This is an acute visit for covid  She tested positive today for covid.  She has had 3 vaccines.    4 days ago - Monday night had mild sore throat.  Overnight felt SOB, hot and sweaty.  Tuesday she had body aches, sinus infection symptoms.     Wednesday she started feeling better.  Now congestion, ST, yellowish mucus, mild cough and sweaty at times.    She has been taking Tylenol ES, saline nasal spray, flonase.  It feels like a sinus infection and she wonders if she needs an abx     Review of Systems  Constitutional:  Positive for fever.  HENT:  Positive for congestion, sinus pain and sore throat.        Maybe slight loss of taste and smell  Respiratory:  Positive for cough (mild).   Gastrointestinal:  Positive for diarrhea (minimal). Negative for nausea.  Musculoskeletal:  Positive for myalgias.  Neurological:  Positive for headaches.     Social History   Socioeconomic History   Marital status: Married    Spouse name: Not on file   Number of children: Not on file   Years of education: Not on file   Highest education level: Not on file  Occupational History   Not on file  Tobacco Use   Smoking status: Never   Smokeless tobacco: Never  Vaping Use   Vaping Use: Never used  Substance and Sexual Activity   Alcohol use: Yes    Alcohol/week: 0.0 standard drinks    Comment: weekends   Drug use: No    Sexual activity: Yes    Partners: Male    Comment: 1st intercourse- 29, partners- 64, married- 23 yrs   Other Topics Concern   Not on file  Social History Narrative   Not on file   Social Determinants of Health   Financial Resource Strain: Not on file  Food Insecurity: Not on file  Transportation Needs: Not on file  Physical Activity: Not on file  Stress: Not on file  Social Connections: Not on file     Observations/Objective: Appears well in NAD Breathing normally  Skin appears warm and dry   Assessment and Plan:  See Problem List for Assessment and Plan of chronic medical problems.   Follow Up Instructions:    I discussed the assessment and treatment plan with the patient. The patient was provided an opportunity to ask questions and all were answered. The patient agreed with the plan and demonstrated an understanding of the instructions.   The patient was advised to call back or seek an in-person evaluation if the symptoms worsen or if the condition fails to improve as anticipated.    Binnie Rail, MD

## 2020-10-18 NOTE — Assessment & Plan Note (Signed)
Acute Symptoms started 4 days ago, tested positive today She has had 3 vaccines Symptoms mild and improving Currently with sinusitis - reassured this is viral - no need for an antibiotic Continue Tylenol, Flonase and nasal saline rinses Rest, fluids She will let me know if there is no improvement or if there is any concerns

## 2020-11-26 ENCOUNTER — Other Ambulatory Visit: Payer: Self-pay | Admitting: Obstetrics & Gynecology

## 2020-11-26 DIAGNOSIS — Z1231 Encounter for screening mammogram for malignant neoplasm of breast: Secondary | ICD-10-CM

## 2020-12-17 ENCOUNTER — Encounter: Payer: Self-pay | Admitting: Internal Medicine

## 2020-12-17 DIAGNOSIS — Z01818 Encounter for other preprocedural examination: Secondary | ICD-10-CM

## 2020-12-17 DIAGNOSIS — Z1159 Encounter for screening for other viral diseases: Secondary | ICD-10-CM

## 2020-12-31 ENCOUNTER — Other Ambulatory Visit (INDEPENDENT_AMBULATORY_CARE_PROVIDER_SITE_OTHER): Payer: No Typology Code available for payment source

## 2020-12-31 DIAGNOSIS — Z1159 Encounter for screening for other viral diseases: Secondary | ICD-10-CM

## 2020-12-31 DIAGNOSIS — Z01818 Encounter for other preprocedural examination: Secondary | ICD-10-CM | POA: Diagnosis not present

## 2020-12-31 DIAGNOSIS — R7309 Other abnormal glucose: Secondary | ICD-10-CM | POA: Diagnosis not present

## 2020-12-31 LAB — COMPREHENSIVE METABOLIC PANEL
ALT: 37 U/L — ABNORMAL HIGH (ref 0–35)
AST: 20 U/L (ref 0–37)
Albumin: 4.3 g/dL (ref 3.5–5.2)
Alkaline Phosphatase: 69 U/L (ref 39–117)
BUN: 5 mg/dL — ABNORMAL LOW (ref 6–23)
CO2: 24 mEq/L (ref 19–32)
Calcium: 9.6 mg/dL (ref 8.4–10.5)
Chloride: 101 mEq/L (ref 96–112)
Creatinine, Ser: 0.69 mg/dL (ref 0.40–1.20)
GFR: 97.24 mL/min (ref >60.00)
Glucose, Bld: 274 mg/dL — ABNORMAL HIGH (ref 70–99)
Potassium: 3.5 mEq/L (ref 3.5–5.1)
Sodium: 136 mEq/L (ref 135–145)
Total Bilirubin: 0.6 mg/dL (ref 0.2–1.2)
Total Protein: 7.1 g/dL (ref 6.0–8.3)

## 2020-12-31 LAB — CBC WITH DIFFERENTIAL/PLATELET
Basophils Absolute: 0.1 10*3/uL (ref 0.0–0.1)
Basophils Relative: 0.9 % (ref 0.0–3.0)
Eosinophils Absolute: 0.2 10*3/uL (ref 0.0–0.7)
Eosinophils Relative: 3.3 % (ref 0.0–5.0)
HCT: 46.1 % — ABNORMAL HIGH (ref 36.0–46.0)
Hemoglobin: 15.6 g/dL — ABNORMAL HIGH (ref 12.0–15.0)
Lymphocytes Relative: 42.1 % (ref 12.0–46.0)
Lymphs Abs: 2.4 10*3/uL (ref 0.7–4.0)
MCHC: 33.9 g/dL (ref 30.0–36.0)
MCV: 92.3 fl (ref 78.0–100.0)
Monocytes Absolute: 0.4 10*3/uL (ref 0.1–1.0)
Monocytes Relative: 6.6 % (ref 3.0–12.0)
Neutro Abs: 2.6 10*3/uL (ref 1.4–7.7)
Neutrophils Relative %: 47.1 % (ref 43.0–77.0)
Platelets: 256 10*3/uL (ref 150.0–400.0)
RBC: 5 Mil/uL (ref 3.87–5.11)
RDW: 12.7 % (ref 11.5–15.5)
WBC: 5.6 10*3/uL (ref 4.0–10.5)

## 2020-12-31 LAB — HEMOGLOBIN A1C: Hgb A1c MFr Bld: 12 % — ABNORMAL HIGH (ref 4.6–6.5)

## 2021-01-01 LAB — HEPATITIS C ANTIBODY
Hepatitis C Ab: NONREACTIVE
SIGNAL TO CUT-OFF: 0.03 (ref <1.00)

## 2021-01-02 NOTE — Progress Notes (Signed)
Subjective:    Patient ID: Courtney Tran, female    DOB: [birth year], 55 y.o.   MRN: [id number]   This visit occurred during the SARS-CoV-2 public health emergency.  Safety protocols were in place, including screening questions prior to the visit, additional usage of staff PPE, and extensive cleaning of exam room while observing appropriate contact time as indicated for disinfecting solutions.    HPI She is here for pre-operative clearance at the request of Dr Tressa Busman for breast reduction scheduled for 11/3.   She denies any personal or family history of problems with anesthesia or bleeding/blood clot problems.    She has no concerns.  She is taking all her medication as prescribed.   She is exercising regularly - walks 1.5 miles a day with her dog.  With her daily activities she denies chest pain, palpitations, SOB and lightheadedness.       Medications and allergies reviewed with patient and updated if appropriate.  Patient Active Problem List   Diagnosis Date Noted   Diabetes ([place]) 01/03/2021   COVID 10/18/2020   Breast hypertrophy in female 10/03/2020   Breast ptosis 10/03/2020   Chronic bilateral thoracic back pain 10/03/2020   Chronic pain of both shoulders 10/03/2020   Neck pain 10/03/2020   Elevated blood pressure reading 02/12/2020   Left lateral epicondylitis 09/15/2018   Ganglion cyst of wrist 08/23/2015   Peroneal tendinitis 12/05/2014   Dyslipidemia    Anxiety 10/05/2011   Depression 08/21/2008   Rosacea 08/21/2008   GALLSTONES 05/04/2008   ESOPHAGEAL STRICTURE 03/14/2007   Esophageal reflux 03/14/2007    Current Outpatient Medications on File Prior to Visit  Medication Sig Dispense Refill   cholecalciferol (VITAMIN D) 1000 UNITS tablet Take 1,000 Units by mouth daily.     cyanocobalamin 100 MCG tablet Take 100 mcg by mouth daily.     desvenlafaxine (PRISTIQ) 50 MG 24 hr tablet Take 1 tablet (50 mg total) by mouth daily. 90 tablet 1    esomeprazole (NEXIUM) 40 MG capsule Take 1 capsule (40 mg total) by mouth daily before breakfast. 30 capsule 5   estradiol (VIVELLE-DOT) 0.05 MG/24HR patch Place 1 patch (0.05 mg total) onto the skin 2 (two) times a week. 24 patch 4   Fexofenadine HCl (ALLEGRA ALLERGY PO) Take by mouth.     Prenatal Vit-Fe Fumarate-FA (PRENATAL MULTIVITAMIN) TABS tablet Take 1 tablet by mouth daily at 12 noon.     Thiamine HCl (VITAMIN B-1) 250 MG tablet Take 250 mg by mouth daily.     vitamin E 100 UNIT capsule Take 100 Units by mouth.     No current facility-administered medications on file prior to visit.    Past Medical History:  Diagnosis Date   Allergy    Basal cell carcinoma, face 2013   s/p excision, multiple basal cells   DEPRESSION    Dyslipidemia    Esophageal reflux    Esophageal stricture 08/2005   EGD with dilation    GALLSTONES    Obesity, unspecified    Rosacea    Seasonal allergies    Uterine fibroid     Past Surgical History:  Procedure Laterality Date   ABDOMINAL HYSTERECTOMY  03/06/13   COLON RESECTION     colon resection when pt was infant   COLONOSCOPY     EXTERNAL EAR SURGERY     POLYPECTOMY     ROBOTIC ASSISTED TOTAL HYSTERECTOMY N/A 03/06/2013   Procedure: ROBOTIC ASSISTED TOTAL HYSTERECTOMY WITH BILATERAL  SALPINGECTOMY;  Surgeon: Princess Bruins, MD;  Location: [facility];  Service: Gynecology;  Laterality: N/A;   Ruptured bowel repair  1967   UPPER GASTROINTESTINAL ENDOSCOPY     WISDOM TOOTH EXTRACTION      Social History   Socioeconomic History   Marital status: Married    Spouse name: Not on file   Number of children: Not on file   Years of education: Not on file   Highest education level: Not on file  Occupational History   Not on file  Tobacco Use   Smoking status: Never   Smokeless tobacco: Never  Vaping Use   Vaping Use: Never used  Substance and Sexual Activity   Alcohol use: Yes    Alcohol/week: 0.0 standard drinks    Comment: weekends    Drug use: No   Sexual activity: Yes    Partners: Male    Comment: 1st intercourse- 56, partners- 53, married- 2 yrs   Other Topics Concern   Not on file  Social History Narrative   Not on file   Social Determinants of Health   Financial Resource Strain: Not on file  Food Insecurity: Not on file  Transportation Needs: Not on file  Physical Activity: Not on file  Stress: Not on file  Social Connections: Not on file    Family History  Problem Relation Age of Onset   Cirrhosis Mother    Hyperlipidemia Father    Hypertension Father    Colon polyps Father    Diabetes Mellitus I Father 64   Diabetes Maternal Aunt    Colon cancer Paternal Uncle 74   Colon polyps Brother    Colon cancer Paternal Aunt 78   Esophageal cancer Neg Hx    Rectal cancer Neg Hx    Stomach cancer Neg Hx     Review of Systems  Constitutional:  Negative for chills and fever.  Eyes:  Negative for visual disturbance.  Respiratory:  Negative for cough, shortness of breath and wheezing.   Cardiovascular:  Negative for chest pain, palpitations and leg swelling.  Gastrointestinal:  Negative for abdominal pain, blood in stool, constipation, diarrhea and nausea.  Endocrine: Positive for polydipsia. Negative for polyuria.  Genitourinary:  Negative for dysuria and hematuria.  Skin:  Negative for rash.  Neurological:  Negative for dizziness, light-headedness and headaches.  Psychiatric/Behavioral:  Positive for sleep disturbance (stress from work).       Objective:   Vitals:   01/03/21 0820  BP: 122/70  Pulse: 87  Temp: 98.4 F (36.9 C)   Filed Weights   01/03/21 0820  Weight: 192 lb (87.1 kg)   Body mass index is 31.95 kg/m.  BP Readings from Last 3 Encounters:  01/03/21 122/70  03/28/20 126/84  03/25/20 140/88    Wt Readings from Last 3 Encounters:  01/03/21 192 lb (87.1 kg)  03/28/20 200 lb (90.7 kg)  03/25/20 204 lb 9.6 oz (92.8 kg)     Physical Exam Constitutional: She appears  well-developed and well-nourished. No distress.  HENT:  Head: Normocephalic and atraumatic.  Right Ear: External ear normal. Normal ear canal and TM Left Ear: External ear normal.  Normal ear canal and TM Mouth/Throat: Oropharynx is clear and moist.  Eyes: Conjunctivae and EOM are normal.  Neck: Neck supple. No tracheal deviation present. No thyromegaly present.  No carotid bruit  Cardiovascular: Normal rate, regular rhythm and normal heart sounds.  No murmur heard.  No edema. Pulmonary/Chest: Effort normal and breath sounds normal. No  respiratory distress. She has no wheezes. She has no rales.  Abdominal: Soft. She exhibits no distension. There is no tenderness.  Lymphadenopathy: She has no cervical adenopathy.  Skin: Skin is warm and dry. She is not diaphoretic.  Psychiatric: She has a normal mood and affect. Her behavior is normal.     Lab Results  Component Value Date   WBC 5.6 12/31/2020   HGB 15.6 (H) 12/31/2020   HCT 46.1 (H) 12/31/2020   PLT 256.0 12/31/2020   GLUCOSE 274 (H) 12/31/2020   CHOL 253 (H) 05/31/2014   TRIG 159.0 (H) 05/31/2014   HDL 43.10 05/31/2014   LDLDIRECT 175.8 11/07/2012   LDLCALC 178 (H) 05/31/2014   ALT 37 (H) 12/31/2020   AST 20 12/31/2020   NA 136 12/31/2020   K 3.5 12/31/2020   CL 101 12/31/2020   CREATININE 0.69 12/31/2020   BUN 5 (L) 12/31/2020   CO2 24 12/31/2020   TSH 1.85 05/31/2014   HGBA1C 12.0 (H) 12/31/2020    EKG: Normal sinus rhythm at 73 bpm, possible LAE, otherwise normal EKG.  Compared to EKG from 09/2008 there are no changes.     Assessment & Plan:    See Problem List for Assessment and Plan of chronic medical problems.

## 2021-01-03 ENCOUNTER — Telehealth: Payer: Self-pay

## 2021-01-03 ENCOUNTER — Ambulatory Visit (INDEPENDENT_AMBULATORY_CARE_PROVIDER_SITE_OTHER): Payer: No Typology Code available for payment source | Admitting: Internal Medicine

## 2021-01-03 ENCOUNTER — Encounter: Payer: Self-pay | Admitting: Internal Medicine

## 2021-01-03 ENCOUNTER — Other Ambulatory Visit: Payer: Self-pay

## 2021-01-03 VITALS — BP 122/70 | HR 87 | Temp 98.4°F | Ht 65.0 in | Wt 192.0 lb

## 2021-01-03 DIAGNOSIS — F3289 Other specified depressive episodes: Secondary | ICD-10-CM

## 2021-01-03 DIAGNOSIS — K219 Gastro-esophageal reflux disease without esophagitis: Secondary | ICD-10-CM

## 2021-01-03 DIAGNOSIS — E119 Type 2 diabetes mellitus without complications: Secondary | ICD-10-CM

## 2021-01-03 DIAGNOSIS — Z01818 Encounter for other preprocedural examination: Secondary | ICD-10-CM | POA: Diagnosis not present

## 2021-01-03 MED ORDER — RYBELSUS 3 MG PO TABS: 3.0000 mg | ORAL_TABLET | Freq: Every day | ORAL | 0 refills | Status: DC

## 2021-01-03 MED ORDER — BLOOD GLUCOSE MONITOR KIT: PACK | 0 refills | Status: DC

## 2021-01-03 MED ORDER — GLIPIZIDE 5 MG PO TABS: 5.0000 mg | ORAL_TABLET | Freq: Two times a day (BID) | ORAL | 3 refills | Status: DC

## 2021-01-03 NOTE — Assessment & Plan Note (Signed)
Chronic Controlled, stable Continue pristiq 50 mg daily

## 2021-01-03 NOTE — Assessment & Plan Note (Signed)
New She has been eating a lot of sugar/carbohydrates and has not been eating well for approximately the past 3 months Recent A1c 12.0-possibly some increased urination and thirst, but otherwise asymptomatic Stressed the importance of getting his sugars better controlled Glucometer ordered Start glipizide 5 mg twice daily with meals Start Rybelsus 3 mg daily-hopefully this is covered and we can increase this after month if tolerated.  Discussed possible side effects Referred to diabetic education Stressed diet changes Continue regular exercise

## 2021-01-03 NOTE — Telephone Encounter (Signed)
Dia Sitter (Key: [id number])  [facility] is reviewing your PA request. You may close this dialog, return to your dashboard, and perform other tasks.  To check for an update later, refresh this page, or open this request again from your dashboard.  To follow up on this request after 72 hours, please contact [facility] directly at [phone].

## 2021-01-03 NOTE — Assessment & Plan Note (Signed)
Chronic GERD controlled Continue nexium 40 mg daily  

## 2021-01-03 NOTE — Patient Instructions (Addendum)
  EKG done today.  Your sugar was check today.    Medications changes include :   glipizide 5 mg twice daily.   Rybelsus 3 mg daily  Your prescription(s) have been submitted to your pharmacy. Please take as directed and contact our office if you believe you are having problem(s) with the medication(s).   A referral was ordered for a nutritionist.    Please followup in 2 months

## 2021-01-03 NOTE — Assessment & Plan Note (Signed)
She is here for preoperative evaluation at request of Dr. Tressa Busman for breast reduction surgery scheduled for 01/16/2021. With preoperative blood work she is a newly diagnosed diabetic with an A1c of 12.0.  She was started on 2 oral medications today and I expect her to respond quickly with drastic diet changes EKG today is normal She has no symptoms suggestive of cardiovascular disease and blood pressure is well controlled.  No history of respiratory disease.  She is cleared for surgery if Dr. Morley Kos is okay with her elevated sugars-I did discuss with Joelene Millin but this may be a concern because of delayed healing and risk of infection.

## 2021-01-03 NOTE — Telephone Encounter (Signed)
Error

## 2021-01-03 NOTE — Telephone Encounter (Signed)
Courtney Tran (Courtney Tran) Rx #: [id number] Rybelsus 3MG  tablets   Form [facility] (MRx) Cytogeneticist Prior Authorization Form 2017 Created 3 hours ago Sent to Plan 1 hour ago Plan Response 1 hour ago Submit Clinical Questions 1 hour ago Determination Favorable 10 minutes ago Message from [employer] PA has been Approved.PA End Date: 01/03/2022

## 2021-01-08 ENCOUNTER — Encounter: Payer: Self-pay | Admitting: Internal Medicine

## 2021-01-08 MED ORDER — TRULICITY 0.75 MG/0.5ML ~~LOC~~ SOAJ: 0.7500 mg | SUBCUTANEOUS | 0 refills | Status: DC

## 2021-01-09 ENCOUNTER — Telehealth: Payer: Self-pay

## 2021-01-09 ENCOUNTER — Ambulatory Visit
Admission: RE | Admit: 2021-01-09 | Discharge: 2021-01-09 | Disposition: A | Discharge status: Home / Self Care | Payer: No Typology Code available for payment source | Source: Ambulatory Visit

## 2021-01-09 ENCOUNTER — Other Ambulatory Visit: Payer: Self-pay

## 2021-01-09 DIAGNOSIS — Z1231 Encounter for screening mammogram for malignant neoplasm of breast: Secondary | ICD-10-CM

## 2021-01-09 NOTE — Telephone Encounter (Signed)
(  Key: [id number])

## 2021-01-16 HISTORY — PX: BREAST REDUCTION SURGERY: SHX8

## 2021-01-27 ENCOUNTER — Encounter: Payer: Self-pay | Admitting: Internal Medicine

## 2021-01-27 DIAGNOSIS — K222 Esophageal obstruction: Secondary | ICD-10-CM

## 2021-01-28 ENCOUNTER — Other Ambulatory Visit: Payer: Self-pay | Admitting: Internal Medicine

## 2021-01-29 MED ORDER — TRULICITY 1.5 MG/0.5ML ~~LOC~~ SOAJ: 1.5000 mg | SUBCUTANEOUS | 0 refills | Status: DC

## 2021-02-04 ENCOUNTER — Other Ambulatory Visit: Payer: Self-pay | Admitting: Internal Medicine

## 2021-02-27 ENCOUNTER — Encounter: Payer: Self-pay | Admitting: Internal Medicine

## 2021-03-01 ENCOUNTER — Other Ambulatory Visit: Payer: Self-pay | Admitting: Internal Medicine

## 2021-03-03 ENCOUNTER — Other Ambulatory Visit: Payer: Self-pay | Admitting: Internal Medicine

## 2021-03-05 ENCOUNTER — Telehealth: Payer: Self-pay

## 2021-03-05 NOTE — Telephone Encounter (Signed)
Left message for pt to call back  °

## 2021-03-05 NOTE — Telephone Encounter (Signed)
Left message for pt to call back.  Spoke with pt and she did want to do the procedure tomorrow. OV cancelled, pt scheduled for EGD with dil in the [facility] 03/07/21 at 1:30pm. Pt aware of appt, instructions for EGD sent to pt via mychart. Amb referral in epic.

## 2021-03-05 NOTE — Telephone Encounter (Signed)
Patient returning your call, please advise. 

## 2021-03-05 NOTE — Telephone Encounter (Signed)
-----   Message from Irene Shipper, MD sent at 03/05/2021 12:04 PM EST ----- Regarding: Consider EGD in [place] versus office visit Vaughan Basta, I was looking at my schedule.  I know this patient personally and professionally.  She is currently scheduled for an office visit this Friday morning.  It looks like she needs an EGD with dilation.  Please call her and see if she would prefer to forego the office appointment on Friday.  Rather, she could be an EGD with dilation in the [facility] that same day (this Friday, December 23) at 1:30 PM slot.  I thought she might appreciate that.  Let me know. Thanks, JP

## 2021-03-06 ENCOUNTER — Other Ambulatory Visit: Payer: Self-pay

## 2021-03-06 DIAGNOSIS — R1310 Dysphagia, unspecified: Secondary | ICD-10-CM

## 2021-03-07 ENCOUNTER — Ambulatory Visit (AMBULATORY_SURGERY_CENTER): Payer: No Typology Code available for payment source | Admitting: Internal Medicine

## 2021-03-07 ENCOUNTER — Encounter: Payer: Self-pay | Admitting: Internal Medicine

## 2021-03-07 ENCOUNTER — Ambulatory Visit: Payer: No Typology Code available for payment source | Admitting: Internal Medicine

## 2021-03-07 VITALS — BP 143/84 | HR 75 | Temp 98.1°F | Resp 16 | Ht 65.0 in | Wt 200.0 lb

## 2021-03-07 DIAGNOSIS — K222 Esophageal obstruction: Secondary | ICD-10-CM | POA: Diagnosis not present

## 2021-03-07 DIAGNOSIS — K219 Gastro-esophageal reflux disease without esophagitis: Secondary | ICD-10-CM

## 2021-03-07 DIAGNOSIS — R1310 Dysphagia, unspecified: Secondary | ICD-10-CM

## 2021-03-07 MED ORDER — SODIUM CHLORIDE 0.9 % IV SOLN: 500.0000 mL | Freq: Once | INTRAVENOUS | Status: DC

## 2021-03-07 NOTE — Progress Notes (Signed)
Called to room to assist during endoscopic procedure.  Patient ID and intended procedure confirmed with present staff. Received instructions for my participation in the procedure from the performing physician.  

## 2021-03-07 NOTE — Op Note (Signed)
[place] [facility] Patient Name: Courtney Tran Procedure Date: 03/07/2021 12:55 PM MRN: [id number] Endoscopist: Docia Chuck. Henrene Pastor , MD Age: 55 Referring MD:  Date of Birth: [birth year] Gender: Female Account #: [id number] Procedure:                Upper GI endoscopy with [facility] dilation of the                            esophagus. 10 [place] Indications:              Dysphagia, Therapeutic procedure Medicines:                Monitored Anesthesia Care Procedure:                Pre-Anesthesia Assessment:                           - Prior to the procedure, a History and Physical                            was performed, and patient medications and                            allergies were reviewed. The patient's tolerance of                            previous anesthesia was also reviewed. The risks                            and benefits of the procedure and the sedation                            options and risks were discussed with the patient.                            All questions were answered, and informed consent                            was obtained. Prior Anticoagulants: The patient has                            taken no previous anticoagulant or antiplatelet                            agents. ASA Grade Assessment: II - A patient with                            mild systemic disease. After reviewing the risks                            and benefits, the patient was deemed in                            satisfactory condition to undergo the procedure.  After obtaining informed consent, the endoscope was                            passed under direct vision. Throughout the                            procedure, the patient's blood pressure, pulse, and                            oxygen saturations were monitored continuously. The                            Endoscope was introduced through the mouth, and                            advanced to the  second part of duodenum. The upper                            GI endoscopy was accomplished without difficulty.                            The patient tolerated the procedure well. Scope In: Scope Out: Findings:                 One benign-appearing, intrinsic moderate stenosis                            was found 35 cm from the incisors. This stenosis                            measured 1.5 cm (inner diameter). The scope was                            withdrawn. After completing the endoscopic survey,                            a dilation was performed with a Maloney dilator                            with no resistance at 54 Fr.                           The exam of the esophagus was otherwise normal.                           The stomach revealed a small hiatal hernia. In                            addition retained gastric contents consistent with                            gastroparesis.                           The examined duodenum  was normal.                           The cardia and gastric fundus were normal on                            retroflexion. Complications:            No immediate complications. Estimated Blood Loss:     Estimated blood loss: none. Impression:               1. Esophageal stricture status post dilation                           2. GERD                           3. Diabetic gastroparesis. Recommendation:           1. Patient has a contact number available for                            emergencies. The signs and symptoms of potential                            delayed complications were discussed with the                            patient. Return to normal activities tomorrow.                            Written discharge instructions were provided to the                            patient.                           2. Post dilation diet.                           3. Continue present medications.                           4. Better diabetic control                            5. Contact Dr. Henrene Pastor if you have any further or                            recurrent swallowing difficulties Athena Baltz N. Henrene Pastor, MD 03/07/2021 1:34:00 PM This report has been signed electronically.

## 2021-03-07 NOTE — Progress Notes (Signed)
VS-CW 

## 2021-03-07 NOTE — Progress Notes (Signed)
Report to PACU, RN, vss, BBS= Clear.  

## 2021-03-07 NOTE — Patient Instructions (Signed)
Impression/Recommendations:  Post-Dilation diet and GERD handouts given to patient.  Continue present medications.  Contact Dr. Henrene Pastor if you have any further or recurrent swallowing difficulties.  YOU HAD AN ENDOSCOPIC PROCEDURE TODAY AT [facility]:   Refer to the procedure report that was given to you for any specific questions about what was found during the examination.  If the procedure report does not answer your questions, please call your gastroenterologist to clarify.  If you requested that your care partner not be given the details of your procedure findings, then the procedure report has been included in a sealed envelope for you to review at your convenience later.  YOU SHOULD EXPECT: Some feelings of bloating in the abdomen. Passage of more gas than usual.  Walking can help get rid of the air that was put into your GI tract during the procedure and reduce the bloating. If you had a lower endoscopy (such as a colonoscopy or flexible sigmoidoscopy) you may notice spotting of blood in your stool or on the toilet paper. If you underwent a bowel prep for your procedure, you may not have a normal bowel movement for a few days.  Please Note:  You might notice some irritation and congestion in your nose or some drainage.  This is from the oxygen used during your procedure.  There is no need for concern and it should clear up in a day or so.  SYMPTOMS TO REPORT IMMEDIATELY:  Following upper endoscopy (EGD)  Vomiting of blood or coffee ground material  New chest pain or pain under the shoulder blades  Painful or persistently difficult swallowing  New shortness of breath  Fever of 100F or higher  Black, tarry-looking stools  For urgent or emergent issues, a gastroenterologist can be reached at any hour by calling [phone]. Do not use MyChart messaging for urgent concerns.    DIET:  We do recommend a small meal at first, but then you may proceed to your regular  diet.  Drink plenty of fluids but you should avoid alcoholic beverages for 24 hours.  ACTIVITY:  You should plan to take it easy for the rest of today and you should NOT DRIVE or use heavy machinery until tomorrow (because of the sedation medicines used during the test).    FOLLOW UP: Our staff will call the number listed on your records 48-72 hours following your procedure to check on you and address any questions or concerns that you may have regarding the information given to you following your procedure. If we do not reach you, we will leave a message.  We will attempt to reach you two times.  During this call, we will ask if you have developed any symptoms of COVID 19. If you develop any symptoms (ie: fever, flu-like symptoms, shortness of breath, cough etc.) before then, please call [phone].  If you test positive for Covid 19 in the 2 weeks post procedure, please call and report this information to [place].    If any biopsies were taken you will be contacted by phone or by letter within the next 1-3 weeks.  Please call us at [phone] if you have not heard about the biopsies in 3 weeks.    SIGNATURES/CONFIDENTIALITY: You and/or your care partner have signed paperwork which will be entered into your electronic medical record.  These signatures attest to the fact that that the information above on your After Visit Summary has been reviewed and is understood.  Full responsibility  of the confidentiality of this discharge information lies with you and/or your care-partner.

## 2021-03-07 NOTE — Progress Notes (Signed)
HISTORY OF PRESENT ILLNESS:  Courtney Tran is a 55 y.o. female with history of GERD complicated by peptic stricture requiring esophageal dilation.  She presents for repeat upper endoscopy with esophageal dilation due to symptomatic peptic stricture.  REVIEW OF SYSTEMS:  All non-GI ROS negative.  Past Medical History:  Diagnosis Date   Allergy    Basal cell carcinoma, face 2013   s/p excision, multiple basal cells   DEPRESSION    Diabetes mellitus without complication ([place])    Dyslipidemia    Esophageal reflux    Esophageal stricture 08/2005   EGD with dilation    GALLSTONES    Obesity, unspecified    Rosacea    Seasonal allergies    Uterine fibroid     Past Surgical History:  Procedure Laterality Date   ABDOMINAL HYSTERECTOMY  03/06/13   COLON RESECTION     colon resection when pt was infant   COLONOSCOPY     EXTERNAL EAR SURGERY     POLYPECTOMY     ROBOTIC ASSISTED TOTAL HYSTERECTOMY N/A 03/06/2013   Procedure: ROBOTIC ASSISTED TOTAL HYSTERECTOMY WITH BILATERAL SALPINGECTOMY;  Surgeon: Princess Bruins, MD;  Location: [facility];  Service: Gynecology;  Laterality: N/A;   Ruptured bowel repair  1967   UPPER GASTROINTESTINAL ENDOSCOPY     WISDOM TOOTH EXTRACTION      Social History Courtney Tran  reports that she has never smoked. She has never used smokeless tobacco. She reports current alcohol use. She reports that she does not use drugs.  family history includes Cirrhosis in her mother; Colon cancer (age of onset: 100) in her paternal uncle; Colon cancer (age of onset: 55) in her paternal aunt; Colon polyps in her brother and father; Diabetes in her maternal aunt; Diabetes Mellitus I (age of onset: 26) in her father; Hyperlipidemia in her father; Hypertension in her father.  Allergies  Allergen Reactions   Lactose Other (See Comments)   Lactose Intolerance (Gi)    Latex Rash       PHYSICAL EXAMINATION:  Vital signs: BP 129/83    Pulse 81    Temp 98.1 F  (36.7 C)    Ht 5\' 5"  (1.651 m)    Wt 200 lb (90.7 kg)    LMP 03/02/2013    SpO2 97%    BMI 33.28 kg/m  General: Well-developed, well-nourished, no acute distress HEENT: Sclerae are anicteric, conjunctiva pink. Oral mucosa intact Lungs: Clear Heart: Regular Abdomen: soft, nontender, nondistended, no obvious ascites, no peritoneal signs, normal bowel sounds. No organomegaly. Extremities: No edema Psychiatric: alert and oriented x3. Cooperative     ASSESSMENT:  1.  GERD complicated by peptic stricture now with recurrent dysphagia   PLAN:  1.  Upper endoscopy with esophageal dilation

## 2021-03-12 ENCOUNTER — Telehealth: Payer: Self-pay

## 2021-03-12 NOTE — Telephone Encounter (Signed)
Left message on follow up call. 

## 2021-03-26 ENCOUNTER — Other Ambulatory Visit: Payer: Self-pay | Admitting: Internal Medicine

## 2021-04-01 ENCOUNTER — Other Ambulatory Visit: Payer: Self-pay

## 2021-04-01 ENCOUNTER — Encounter: Payer: Self-pay | Admitting: Obstetrics & Gynecology

## 2021-04-01 ENCOUNTER — Ambulatory Visit (INDEPENDENT_AMBULATORY_CARE_PROVIDER_SITE_OTHER): Payer: No Typology Code available for payment source | Admitting: Obstetrics & Gynecology

## 2021-04-01 VITALS — BP 110/80 | HR 97 | Resp 16 | Ht 65.5 in | Wt 200.0 lb

## 2021-04-01 DIAGNOSIS — Z79890 Hormone replacement therapy (postmenopausal): Secondary | ICD-10-CM

## 2021-04-01 DIAGNOSIS — Z90710 Acquired absence of both cervix and uterus: Secondary | ICD-10-CM

## 2021-04-01 DIAGNOSIS — Z6832 Body mass index (BMI) 32.0-32.9, adult: Secondary | ICD-10-CM

## 2021-04-01 DIAGNOSIS — Z01419 Encounter for gynecological examination (general) (routine) without abnormal findings: Secondary | ICD-10-CM

## 2021-04-01 DIAGNOSIS — E6609 Other obesity due to excess calories: Secondary | ICD-10-CM

## 2021-04-01 MED ORDER — ESTRADIOL 0.05 MG/24HR TD PTTW: 1.0000 | MEDICATED_PATCH | TRANSDERMAL | 4 refills | Status: DC

## 2021-04-01 NOTE — Progress Notes (Signed)
Courtney Tran April 28, 1965 [id number]   History:    56 y.o.  G0 Married.  Husband is a Research scientist (physical sciences).  Patient is not working.   RP:  Established patient presenting for annual gyn exam    HPI: Status post robotic TLH with bilateral salpingectomy in January 2015. Postmenopause, well on Estradiol patch 0.05 twice weekly.  No pelvic pain.  No pain with IC. Pap negative 03/2018.  Urine and bowel movements normal.  Breasts normal, had a bilateral breast reduction in 01/2021.  Screening Mammo negative 12/2020.  Body mass index 32.78.  Daily walks with her dog. Health labs with family physician.  Colono 2021 Benign polyps.  Fam h/o Colon Ca.     Aex//jj PAP: 03-23-18, MMG: 01-09-21, COLONOSCOPY: 11-06-19 Past medical history,surgical history, family history and social history were all reviewed and documented in the EPIC chart.  Gynecologic History Patient's last menstrual period was 03/02/2013.  Obstetric History OB History  Gravida Para Term Preterm AB Living  0 0 0 0 0 0  SAB IAB Ectopic Multiple Live Births  0 0 0 0 0     ROS: A ROS was performed and pertinent positives and negatives are included in the history.  GENERAL: No fevers or chills. HEENT: No change in vision, no earache, sore throat or sinus congestion. NECK: No pain or stiffness. CARDIOVASCULAR: No chest pain or pressure. No palpitations. PULMONARY: No shortness of breath, cough or wheeze. GASTROINTESTINAL: No abdominal pain, nausea, vomiting or diarrhea, melena or bright red blood per rectum. GENITOURINARY: No urinary frequency, urgency, hesitancy or dysuria. MUSCULOSKELETAL: No joint or muscle pain, no back pain, no recent trauma. DERMATOLOGIC: No rash, no itching, no lesions. ENDOCRINE: No polyuria, polydipsia, no heat or cold intolerance. No recent change in weight. HEMATOLOGICAL: No anemia or easy bruising or bleeding. NEUROLOGIC: No headache, seizures, numbness, tingling or weakness. PSYCHIATRIC: No depression, no loss of interest  in normal activity or change in sleep pattern.     Exam:   BP 110/80    Pulse 97    Resp 16    Ht 5' 5.5" (1.664 m)    Wt 200 lb (90.7 kg)    LMP 03/02/2013    BMI 32.78 kg/m   Body mass index is 32.78 kg/m.  General appearance : Well developed well nourished female. No acute distress HEENT: Eyes: no retinal hemorrhage or exudates,  Neck supple, trachea midline, no carotid bruits, no thyroidmegaly Lungs: Clear to auscultation, no rhonchi or wheezes, or rib retractions  Heart: Regular rate and rhythm, no murmurs or gallops Breast:Examined in sitting and supine position were symmetrical in appearance, no palpable masses or tenderness,  no skin retraction, no nipple inversion, no nipple discharge, no skin discoloration, no axillary or supraclavicular lymphadenopathy Abdomen: no palpable masses or tenderness, no rebound or guarding Extremities: no edema or skin discoloration or tenderness  Pelvic: Vulva: Normal             Vagina: No gross lesions or discharge  Cervix/Uterus absent  Adnexa  Without masses or tenderness  Anus: Normal   Assessment/Plan:  56 y.o. female for annual exam   1. Well female exam with routine gynecological exam Status post robotic TLH with bilateral salpingectomy in January 2015. Postmenopause, well on Estradiol patch 0.05 twice weekly.  No pelvic pain.  No pain with IC. Pap negative 03/2018.  Urine and bowel movements normal.  Breasts normal, had a bilateral breast reduction in 01/2021.  Screening Mammo negative 12/2020.  Body mass  index 32.78.  Daily walks with her dog. Health labs with family physician.  Colono 2021 Benign polyps.  Fam h/o Colon Ca.    2. S/P total hysterectomy  3. Postmenopausal hormone replacement therapy Status post robotic TLH with bilateral salpingectomy in January 2015. Postmenopause, well on Estradiol patch 0.05 twice weekly. No CI to continue.  Prescription sent to pharmacy. No pelvic pain.  No pain with IC.   4. Class 1 obesity due  to excess calories without serious comorbidity with body mass index (BMI) of 32.0 to 32.9 in adult Lower calorie/carb diet.  Continue with fitness.  Other orders - estradiol (VIVELLE-DOT) 0.05 MG/24HR patch; Place 1 patch (0.05 mg total) onto the skin 2 (two) times a week.   Princess Bruins MD, 8:29 AM 04/01/2021

## 2021-04-23 ENCOUNTER — Other Ambulatory Visit: Payer: Self-pay | Admitting: Internal Medicine

## 2021-04-30 ENCOUNTER — Other Ambulatory Visit: Payer: Self-pay

## 2021-04-30 ENCOUNTER — Encounter: Payer: Self-pay | Admitting: Internal Medicine

## 2021-04-30 ENCOUNTER — Other Ambulatory Visit: Payer: Self-pay | Admitting: Internal Medicine

## 2021-04-30 MED ORDER — TRULICITY 1.5 MG/0.5ML ~~LOC~~ SOAJ: SUBCUTANEOUS | 2 refills | Status: DC

## 2021-05-02 ENCOUNTER — Encounter: Payer: Self-pay | Admitting: Internal Medicine

## 2021-05-02 MED ORDER — TIRZEPATIDE 2.5 MG/0.5ML ~~LOC~~ SOAJ: 2.5000 mg | SUBCUTANEOUS | 0 refills | Status: DC

## 2021-05-03 ENCOUNTER — Other Ambulatory Visit: Payer: Self-pay | Admitting: Internal Medicine

## 2021-05-04 MED ORDER — TIRZEPATIDE 2.5 MG/0.5ML ~~LOC~~ SOAJ: 2.5000 mg | SUBCUTANEOUS | 0 refills | Status: DC

## 2021-05-06 ENCOUNTER — Encounter: Payer: Self-pay | Admitting: Internal Medicine

## 2021-05-06 ENCOUNTER — Other Ambulatory Visit: Payer: Self-pay

## 2021-05-06 MED ORDER — TRULICITY 1.5 MG/0.5ML ~~LOC~~ SOAJ: SUBCUTANEOUS | 2 refills | Status: DC

## 2021-05-20 ENCOUNTER — Encounter: Payer: Self-pay | Admitting: Internal Medicine

## 2021-05-20 MED ORDER — TIRZEPATIDE 5 MG/0.5ML ~~LOC~~ SOAJ: 5.0000 mg | SUBCUTANEOUS | 0 refills | Status: DC

## 2021-05-21 ENCOUNTER — Encounter: Payer: Self-pay | Admitting: Internal Medicine

## 2021-06-02 ENCOUNTER — Encounter: Payer: Self-pay | Admitting: Obstetrics & Gynecology

## 2021-06-02 ENCOUNTER — Encounter: Payer: Self-pay | Admitting: Internal Medicine

## 2021-06-02 MED ORDER — ESTRADIOL 0.05 MG/24HR TD PTTW: 1.0000 | MEDICATED_PATCH | TRANSDERMAL | 3 refills | Status: DC

## 2021-06-03 ENCOUNTER — Other Ambulatory Visit: Payer: Self-pay

## 2021-06-03 MED ORDER — DESVENLAFAXINE SUCCINATE ER 50 MG PO TB24: 50.0000 mg | ORAL_TABLET | Freq: Every day | ORAL | 1 refills | Status: DC

## 2021-06-20 ENCOUNTER — Encounter: Payer: Self-pay | Admitting: Internal Medicine

## 2021-07-30 ENCOUNTER — Encounter: Payer: Self-pay | Admitting: Internal Medicine

## 2021-07-30 MED ORDER — TIRZEPATIDE 7.5 MG/0.5ML ~~LOC~~ SOAJ: 7.5000 mg | SUBCUTANEOUS | 0 refills | Status: DC

## 2021-08-01 ENCOUNTER — Other Ambulatory Visit: Payer: Self-pay

## 2021-08-01 MED ORDER — TIRZEPATIDE 7.5 MG/0.5ML ~~LOC~~ SOAJ
7.5000 mg | SUBCUTANEOUS | 0 refills | Status: DC
Start: 2021-08-01 — End: 2021-08-26

## 2021-08-26 MED ORDER — TIRZEPATIDE 10 MG/0.5ML ~~LOC~~ SOAJ: 10.0000 mg | SUBCUTANEOUS | 0 refills | Status: DC

## 2021-08-26 NOTE — Addendum Note (Signed)
Addended by: Binnie Rail on: 08/26/2021 07:22 AM   Modules accepted: Orders

## 2021-09-18 ENCOUNTER — Other Ambulatory Visit (HOSPITAL_COMMUNITY): Payer: Self-pay

## 2021-09-18 ENCOUNTER — Other Ambulatory Visit: Payer: Self-pay

## 2021-09-18 MED ORDER — TIRZEPATIDE 10 MG/0.5ML ~~LOC~~ SOAJ
10.0000 mg | SUBCUTANEOUS | 0 refills | Status: DC
  Filled 2021-09-18: qty 2, 28d supply, fill #0

## 2021-10-23 ENCOUNTER — Encounter: Payer: Self-pay | Admitting: Internal Medicine

## 2021-10-24 ENCOUNTER — Other Ambulatory Visit: Payer: Self-pay

## 2021-10-24 ENCOUNTER — Other Ambulatory Visit (HOSPITAL_COMMUNITY): Payer: Self-pay

## 2021-10-24 MED ORDER — TIRZEPATIDE 12.5 MG/0.5ML ~~LOC~~ SOAJ
12.5000 mg | SUBCUTANEOUS | 0 refills | Status: DC
  Filled 2021-10-24: qty 2, 28d supply, fill #0

## 2021-10-30 ENCOUNTER — Other Ambulatory Visit: Payer: Self-pay | Admitting: Internal Medicine

## 2021-10-30 NOTE — Telephone Encounter (Signed)
Check [facility] registry last refilled date is not on file. MD out office this week pls advise on refill.Marland KitchenJohny Tran

## 2021-11-11 ENCOUNTER — Other Ambulatory Visit (HOSPITAL_COMMUNITY): Payer: Self-pay

## 2021-11-11 ENCOUNTER — Other Ambulatory Visit: Payer: Self-pay

## 2021-11-11 MED ORDER — TIRZEPATIDE 15 MG/0.5ML ~~LOC~~ SOAJ
15.0000 mg | SUBCUTANEOUS | 1 refills | Status: DC
  Filled 2021-11-11: qty 2, 28d supply, fill #0

## 2021-11-20 ENCOUNTER — Encounter: Payer: No Typology Code available for payment source | Admitting: Internal Medicine

## 2021-11-23 ENCOUNTER — Encounter: Payer: Self-pay | Admitting: Internal Medicine

## 2021-11-23 NOTE — Patient Instructions (Addendum)
Shingles vaccine today   Blood work was ordered.     Medications changes include :   none    Return in about 6 months (around 05/28/2022) for follow up.   Health Maintenance, Female Adopting a healthy lifestyle and getting preventive care are important in promoting health and wellness. Ask your health care provider about: The right schedule for you to have regular tests and exams. Things you can do on your own to prevent diseases and keep yourself healthy. What should I know about diet, weight, and exercise? Eat a healthy diet  Eat a diet that includes plenty of vegetables, fruits, low-fat dairy products, and lean protein. Do not eat a lot of foods that are high in solid fats, added sugars, or sodium. Maintain a healthy weight Body mass index (BMI) is used to identify weight problems. It estimates body fat based on height and weight. Your health care provider can help determine your BMI and help you achieve or maintain a healthy weight. Get regular exercise Get regular exercise. This is one of the most important things you can do for your health. Most adults should: Exercise for at least 150 minutes each week. The exercise should increase your heart rate and make you sweat (moderate-intensity exercise). Do strengthening exercises at least twice a week. This is in addition to the moderate-intensity exercise. Spend less time sitting. Even light physical activity can be beneficial. Watch cholesterol and blood lipids Have your blood tested for lipids and cholesterol at 57 years of age, then have this test every 5 years. Have your cholesterol levels checked more often if: Your lipid or cholesterol levels are high. You are older than 57 years of age. You are at high risk for heart disease. What should I know about cancer screening? Depending on your health history and family history, you may need to have cancer screening at various ages. This may include screening for: Breast  cancer. Cervical cancer. Colorectal cancer. Skin cancer. Lung cancer. What should I know about heart disease, diabetes, and high blood pressure? Blood pressure and heart disease High blood pressure causes heart disease and increases the risk of stroke. This is more likely to develop in people who have high blood pressure readings or are overweight. Have your blood pressure checked: Every 3-5 years if you are 49-77 years of age. Every year if you are 85 years old or older. Diabetes Have regular diabetes screenings. This checks your fasting blood sugar level. Have the screening done: Once every three years after age 59 if you are at a normal weight and have a low risk for diabetes. More often and at a younger age if you are overweight or have a high risk for diabetes. What should I know about preventing infection? Hepatitis B If you have a higher risk for hepatitis B, you should be screened for this virus. Talk with your health care provider to find out if you are at risk for hepatitis B infection. Hepatitis C Testing is recommended for: Everyone born from [birth year] through 1966. Anyone with known risk factors for hepatitis C. Sexually transmitted infections (STIs) Get screened for STIs, including gonorrhea and chlamydia, if: You are sexually active and are younger than 57 years of age. You are older than 57 years of age and your health care provider tells you that you are at risk for this type of infection. Your sexual activity has changed since you were last screened, and you are at increased risk for chlamydia or gonorrhea. Ask  your health care provider if you are at risk. Ask your health care provider about whether you are at high risk for HIV. Your health care provider may recommend a prescription medicine to help prevent HIV infection. If you choose to take medicine to prevent HIV, you should first get tested for HIV. You should then be tested every 3 months for as long as you are taking  the medicine. Pregnancy If you are about to stop having your period (premenopausal) and you may become pregnant, seek counseling before you get pregnant. Take 400 to 800 micrograms (mcg) of folic acid every day if you become pregnant. Ask for birth control (contraception) if you want to prevent pregnancy. Osteoporosis and menopause Osteoporosis is a disease in which the bones lose minerals and strength with aging. This can result in bone fractures. If you are 66 years old or older, or if you are at risk for osteoporosis and fractures, ask your health care provider if you should: Be screened for bone loss. Take a calcium or vitamin D supplement to lower your risk of fractures. Be given hormone replacement therapy (HRT) to treat symptoms of menopause. Follow these instructions at home: Alcohol use Do not drink alcohol if: Your health care provider tells you not to drink. You are pregnant, may be pregnant, or are planning to become pregnant. If you drink alcohol: Limit how much you have to: 0-1 drink a day. Know how much alcohol is in your drink. In the [place] one drink equals one 12 oz bottle of beer (355 mL), one 5 oz glass of wine (148 mL), or one 1 oz glass of hard liquor (44 mL). Lifestyle Do not use any products that contain nicotine or tobacco. These products include cigarettes, chewing tobacco, and vaping devices, such as e-cigarettes. If you need help quitting, ask your health care provider. Do not use street drugs. Do not share needles. Ask your health care provider for help if you need support or information about quitting drugs. General instructions Schedule regular health, dental, and eye exams. Stay current with your vaccines. Tell your health care provider if: You often feel depressed. You have ever been abused or do not feel safe at home. Summary Adopting a healthy lifestyle and getting preventive care are important in promoting health and wellness. Follow your health  care provider's instructions about healthy diet, exercising, and getting tested or screened for diseases. Follow your health care provider's instructions on monitoring your cholesterol and blood pressure. This information is not intended to replace advice given to you by your health care provider. Make sure you discuss any questions you have with your health care provider. Document Revised: 07/22/2020 Document Reviewed: 07/22/2020 Elsevier Patient Education  [place].

## 2021-11-23 NOTE — Progress Notes (Signed)
Subjective:    Patient ID: Courtney Tran, female    DOB: November 18, 1965, 56 y.o.   MRN: [id number]      HPI Lourdes is here for a Physical exam.   Overall she is doing well.  She is still losing weight slowly.  She is concerned about needing to be on Mounjaro long-term and if she should find a more permanent solution such as gastric sleeve.  Overall she feels well.   Medications and allergies reviewed with patient and updated if appropriate.  Current Outpatient Medications on File Prior to Visit  Medication Sig Dispense Refill   blood glucose meter kit and supplies KIT Dispense based on patient and insurance preference. Use up to four times daily as directed. (FOR E11.9). 1 each 0   cholecalciferol (VITAMIN D) 1000 UNITS tablet Take 1,000 Units by mouth daily.     cyanocobalamin 100 MCG tablet Take 100 mcg by mouth daily.     desvenlafaxine (PRISTIQ) 50 MG 24 hr tablet TAKE ONE TABLET BY MOUTH DAILY 90 tablet 1   esomeprazole (NEXIUM) 40 MG capsule Take 1 capsule (40 mg total) by mouth daily before breakfast. 30 capsule 5   estradiol (VIVELLE-DOT) 0.05 MG/24HR patch Place 1 patch (0.05 mg total) onto the skin 2 (two) times a week. 24 patch 3   Fexofenadine HCl (ALLEGRA ALLERGY PO) Take by mouth.     glipiZIDE (GLUCOTROL) 5 MG tablet TAKE ONE TABLET BY MOUTH TWICE A DAY BEFORE A MEAL 60 tablet 3   Prenatal Vit-Fe Fumarate-FA (PRENATAL MULTIVITAMIN) TABS tablet Take 1 tablet by mouth daily at 12 noon.     Thiamine HCl (VITAMIN B-1) 250 MG tablet Take 250 mg by mouth daily.     tirzepatide (MOUNJARO) 15 MG/0.5ML Pen Inject 15 mg into the skin once a week. 6 mL 1   vitamin E 100 UNIT capsule Take 100 Units by mouth.     No current facility-administered medications on file prior to visit.    Review of Systems  Constitutional:  Negative for fever.  Eyes:  Negative for visual disturbance.  Respiratory:  Negative for cough, shortness of breath and wheezing.   Cardiovascular:   Negative for chest pain, palpitations and leg swelling.  Gastrointestinal:  Negative for abdominal pain, blood in stool, constipation, diarrhea and nausea.       Gerd controlled  Genitourinary:  Negative for dysuria.  Musculoskeletal:  Negative for arthralgias and back pain.  Skin:  Negative for rash.  Neurological:  Negative for light-headedness and headaches.  Psychiatric/Behavioral:  Positive for dysphoric mood (well controlled). The patient is nervous/anxious (well controlled).        Objective:   Vitals:   11/27/21 0832  BP: 122/78  Pulse: 90  Temp: 98.4 F (36.9 C)  SpO2: 99%   Filed Weights   11/27/21 0832  Weight: 184 lb (83.5 kg)   Body mass index is 30.15 kg/m.  BP Readings from Last 3 Encounters:  11/27/21 122/78  04/01/21 110/80  03/07/21 (!) 143/84    Wt Readings from Last 3 Encounters:  11/27/21 184 lb (83.5 kg)  04/01/21 200 lb (90.7 kg)  03/07/21 200 lb (90.7 kg)       Physical Exam Constitutional: She appears well-developed and well-nourished. No distress.  HENT:  Head: Normocephalic and atraumatic.  Right Ear: External ear normal. Normal ear canal and TM Left Ear: External ear normal.  Normal ear canal and TM Mouth/Throat: Oropharynx is clear and moist.  Eyes: Conjunctivae  normal.  Neck: Neck supple. No tracheal deviation present. No thyromegaly present.  No carotid bruit  Cardiovascular: Normal rate, regular rhythm and normal heart sounds.   No murmur heard.  No edema. Pulmonary/Chest: Effort normal and breath sounds normal. No respiratory distress. She has no wheezes. She has no rales.  Breast: deferred   Abdominal: Soft. She exhibits no distension. There is no tenderness.  Lymphadenopathy: She has no cervical adenopathy.  Skin: Skin is warm and dry. She is not diaphoretic.  Psychiatric: She has a normal mood and affect. Her behavior is normal.     Lab Results  Component Value Date   WBC 5.6 12/31/2020   HGB 15.6 (H) 12/31/2020    HCT 46.1 (H) 12/31/2020   PLT 256.0 12/31/2020   GLUCOSE 274 (H) 12/31/2020   CHOL 253 (H) 05/31/2014   TRIG 159.0 (H) 05/31/2014   HDL 43.10 05/31/2014   LDLDIRECT 175.8 11/07/2012   LDLCALC 178 (H) 05/31/2014   ALT 37 (H) 12/31/2020   AST 20 12/31/2020   NA 136 12/31/2020   K 3.5 12/31/2020   CL 101 12/31/2020   CREATININE 0.69 12/31/2020   BUN 5 (L) 12/31/2020   CO2 24 12/31/2020   TSH 1.85 05/31/2014   HGBA1C 12.0 (H) 12/31/2020         Assessment & Plan:   Physical exam: Screening blood work  ordered Exercise  regular - 4000-5000 steps daily, water aerobics at night during summer, walks dog 2 miles a day Weight  has lost weight with mounjaro - would like to lose more weight Does not feel hungry, has cut back on cravings, cut out red meat, drinks a lot of water, eats chicken, maybe one etoh drink a week Substance abuse  none   Reviewed recommended immunizations.   Shinglrix today   Health Maintenance  Topic Date Due   FOOT EXAM  Never done   OPHTHALMOLOGY EXAM  Never done   Diabetic kidney evaluation - Urine ACR  Never done   Zoster Vaccines- Shingrix (1 of 2) Never done   HEMOGLOBIN A1C  07/01/2021   COVID-19 Vaccine (4 - Moderna series) 12/13/2021 (Originally 01/25/2020)   INFLUENZA VACCINE  06/14/2022 (Originally 10/14/2021)   Diabetic kidney evaluation - GFR measurement  12/31/2021   MAMMOGRAM  01/10/2023   PAP SMEAR-Modifier  03/24/2023   COLONOSCOPY (Pts 45-81yr Insurance coverage will need to be confirmed)  11/05/2024   TETANUS/TDAP  05/16/2029   Hepatitis C Screening  Completed   HPV VACCINES  Aged Out   HIV Screening  Discontinued          See Problem List for Assessment and Plan of chronic medical problems.

## 2021-11-27 ENCOUNTER — Ambulatory Visit (INDEPENDENT_AMBULATORY_CARE_PROVIDER_SITE_OTHER): Payer: No Typology Code available for payment source | Admitting: Internal Medicine

## 2021-11-27 ENCOUNTER — Other Ambulatory Visit: Payer: Self-pay

## 2021-11-27 ENCOUNTER — Encounter: Payer: Self-pay | Admitting: Internal Medicine

## 2021-11-27 ENCOUNTER — Other Ambulatory Visit (HOSPITAL_COMMUNITY): Payer: Self-pay

## 2021-11-27 VITALS — BP 122/78 | HR 90 | Temp 98.4°F | Ht 65.5 in | Wt 184.0 lb

## 2021-11-27 DIAGNOSIS — F3289 Other specified depressive episodes: Secondary | ICD-10-CM | POA: Diagnosis not present

## 2021-11-27 DIAGNOSIS — Z0000 Encounter for general adult medical examination without abnormal findings: Secondary | ICD-10-CM

## 2021-11-27 DIAGNOSIS — F419 Anxiety disorder, unspecified: Secondary | ICD-10-CM | POA: Diagnosis not present

## 2021-11-27 DIAGNOSIS — Z23 Encounter for immunization: Secondary | ICD-10-CM

## 2021-11-27 DIAGNOSIS — K219 Gastro-esophageal reflux disease without esophagitis: Secondary | ICD-10-CM

## 2021-11-27 DIAGNOSIS — E119 Type 2 diabetes mellitus without complications: Secondary | ICD-10-CM

## 2021-11-27 DIAGNOSIS — E785 Hyperlipidemia, unspecified: Secondary | ICD-10-CM | POA: Diagnosis not present

## 2021-11-27 LAB — COMPREHENSIVE METABOLIC PANEL
ALT: 43 U/L — ABNORMAL HIGH (ref 0–35)
AST: 25 U/L (ref 0–37)
Albumin: 4.1 g/dL (ref 3.5–5.2)
Alkaline Phosphatase: 60 U/L (ref 39–117)
BUN: 9 mg/dL (ref 6–23)
CO2: 28 mEq/L (ref 19–32)
Calcium: 10.1 mg/dL (ref 8.4–10.5)
Chloride: 103 mEq/L (ref 96–112)
Creatinine, Ser: 0.96 mg/dL (ref 0.40–1.20)
GFR: 65.91 mL/min (ref >60.00)
Glucose, Bld: 107 mg/dL — ABNORMAL HIGH (ref 70–99)
Potassium: 4.3 mEq/L (ref 3.5–5.1)
Sodium: 138 mEq/L (ref 135–145)
Total Bilirubin: 0.4 mg/dL (ref 0.2–1.2)
Total Protein: 7.6 g/dL (ref 6.0–8.3)

## 2021-11-27 LAB — CBC WITH DIFFERENTIAL/PLATELET
Basophils Absolute: 0.1 10*3/uL (ref 0.0–0.1)
Basophils Relative: 0.8 % (ref 0.0–3.0)
Eosinophils Absolute: 0.2 10*3/uL (ref 0.0–0.7)
Eosinophils Relative: 2.3 % (ref 0.0–5.0)
HCT: 44.5 % (ref 36.0–46.0)
Hemoglobin: 15.4 g/dL — ABNORMAL HIGH (ref 12.0–15.0)
Lymphocytes Relative: 35.5 % (ref 12.0–46.0)
Lymphs Abs: 2.8 10*3/uL (ref 0.7–4.0)
MCHC: 34.6 g/dL (ref 30.0–36.0)
MCV: 90.6 fl (ref 78.0–100.0)
Monocytes Absolute: 0.5 10*3/uL (ref 0.1–1.0)
Monocytes Relative: 6.8 % (ref 3.0–12.0)
Neutro Abs: 4.3 10*3/uL (ref 1.4–7.7)
Neutrophils Relative %: 54.6 % (ref 43.0–77.0)
Platelets: 306 10*3/uL (ref 150.0–400.0)
RBC: 4.91 Mil/uL (ref 3.87–5.11)
RDW: 12.6 % (ref 11.5–15.5)
WBC: 7.9 10*3/uL (ref 4.0–10.5)

## 2021-11-27 LAB — LIPID PANEL
Cholesterol: 216 mg/dL — ABNORMAL HIGH (ref 0–200)
HDL: 37.3 mg/dL — ABNORMAL LOW (ref >39.00)
NonHDL: 179.06
Total CHOL/HDL Ratio: 6
Triglycerides: 205 mg/dL — ABNORMAL HIGH (ref 0.0–149.0)
VLDL: 41 mg/dL — ABNORMAL HIGH (ref 0.0–40.0)

## 2021-11-27 LAB — HEMOGLOBIN A1C: Hgb A1c MFr Bld: 6.5 % (ref 4.6–6.5)

## 2021-11-27 LAB — TSH: TSH: 2.6 u[IU]/mL (ref 0.35–5.50)

## 2021-11-27 LAB — MICROALBUMIN / CREATININE URINE RATIO
Creatinine,U: 228.2 mg/dL
Microalb Creat Ratio: 0.9 mg/g (ref 0.0–30.0)
Microalb, Ur: 2 mg/dL — ABNORMAL HIGH (ref 0.0–1.9)

## 2021-11-27 LAB — LDL CHOLESTEROL, DIRECT: Direct LDL: 199 mg/dL

## 2021-11-27 MED ORDER — TIRZEPATIDE 15 MG/0.5ML ~~LOC~~ SOAJ
15.0000 mg | SUBCUTANEOUS | 1 refills | Status: DC
  Filled 2021-11-27: qty 6, 84d supply, fill #0
  Filled 2021-12-04: qty 2, 28d supply, fill #0
  Filled 2022-01-01: qty 2, 28d supply, fill #1
  Filled 2022-01-29: qty 2, 28d supply, fill #2

## 2021-11-27 NOTE — Assessment & Plan Note (Signed)
Chronic GERD controlled Continue Nexium 40 mg daily 

## 2021-11-27 NOTE — Assessment & Plan Note (Signed)
Chronic Controlled, Stable Continue Pristiq 50 mg daily

## 2021-11-27 NOTE — Assessment & Plan Note (Signed)
Chronic Regular exercise and healthy diet encouraged Check lipid panel History has lost weight and I think her LDL will be improved Discussed that she should still be on a low-dose statin due to elevated risk since she is a diabetic We will see what LDL is and make recommendations

## 2021-11-27 NOTE — Assessment & Plan Note (Addendum)
Chronic  Lab Results  Component Value Date   HGBA1C 12.0 (H) 12/31/2020   Sugars were not controlled when they were last checked, but expect them to be much better Check A1c Continue Mounjaro 15 mg weekly.  Currently taking glipizide 5 mg daily-if A1c is well controlled we will discontinue the glipizide Stressed regular exercise, diabetic diet

## 2021-11-28 ENCOUNTER — Other Ambulatory Visit (HOSPITAL_COMMUNITY): Payer: Self-pay

## 2021-11-28 ENCOUNTER — Encounter: Payer: Self-pay | Admitting: Internal Medicine

## 2021-11-28 DIAGNOSIS — E7849 Other hyperlipidemia: Secondary | ICD-10-CM

## 2021-11-28 MED ORDER — ROSUVASTATIN CALCIUM 5 MG PO TABS
5.0000 mg | ORAL_TABLET | Freq: Every day | ORAL | 3 refills | Status: DC
  Filled 2021-11-28: qty 30, 30d supply, fill #0
  Filled 2021-12-23: qty 30, 30d supply, fill #1
  Filled 2022-01-01 – 2022-01-14 (×2): qty 30, 30d supply, fill #2
  Filled 2022-01-29 – 2022-02-06 (×3): qty 30, 30d supply, fill #3
  Filled 2022-02-27 – 2022-03-03 (×2): qty 30, 30d supply, fill #4

## 2021-11-29 ENCOUNTER — Other Ambulatory Visit (HOSPITAL_COMMUNITY): Payer: Self-pay

## 2021-12-04 ENCOUNTER — Other Ambulatory Visit (HOSPITAL_COMMUNITY): Payer: Self-pay

## 2021-12-06 ENCOUNTER — Other Ambulatory Visit (HOSPITAL_COMMUNITY): Payer: Self-pay

## 2021-12-23 ENCOUNTER — Other Ambulatory Visit (HOSPITAL_COMMUNITY): Payer: Self-pay

## 2021-12-29 ENCOUNTER — Other Ambulatory Visit: Payer: Self-pay | Admitting: Obstetrics & Gynecology

## 2021-12-29 DIAGNOSIS — Z1231 Encounter for screening mammogram for malignant neoplasm of breast: Secondary | ICD-10-CM

## 2022-01-01 ENCOUNTER — Other Ambulatory Visit (HOSPITAL_COMMUNITY): Payer: Self-pay

## 2022-01-02 ENCOUNTER — Other Ambulatory Visit (HOSPITAL_COMMUNITY): Payer: Self-pay

## 2022-01-14 ENCOUNTER — Other Ambulatory Visit (HOSPITAL_COMMUNITY): Payer: Self-pay

## 2022-01-23 ENCOUNTER — Other Ambulatory Visit (HOSPITAL_COMMUNITY): Payer: Self-pay

## 2022-01-26 ENCOUNTER — Other Ambulatory Visit: Payer: Self-pay | Admitting: Internal Medicine

## 2022-01-29 ENCOUNTER — Other Ambulatory Visit (HOSPITAL_COMMUNITY): Payer: Self-pay

## 2022-02-03 ENCOUNTER — Other Ambulatory Visit: Payer: Self-pay

## 2022-02-03 ENCOUNTER — Other Ambulatory Visit (HOSPITAL_COMMUNITY): Payer: Self-pay

## 2022-02-03 MED ORDER — TIRZEPATIDE 15 MG/0.5ML ~~LOC~~ SOAJ
15.0000 mg | SUBCUTANEOUS | 1 refills | Status: DC
  Filled 2022-02-03: qty 2, 28d supply, fill #0
  Filled 2022-02-27 – 2022-03-03 (×2): qty 2, 28d supply, fill #1

## 2022-02-04 ENCOUNTER — Other Ambulatory Visit (HOSPITAL_COMMUNITY): Payer: Self-pay

## 2022-02-06 ENCOUNTER — Other Ambulatory Visit (HOSPITAL_COMMUNITY): Payer: Self-pay

## 2022-02-12 ENCOUNTER — Ambulatory Visit: Payer: No Typology Code available for payment source

## 2022-02-27 ENCOUNTER — Other Ambulatory Visit: Payer: Self-pay | Admitting: Internal Medicine

## 2022-02-27 ENCOUNTER — Other Ambulatory Visit: Payer: Self-pay

## 2022-02-28 ENCOUNTER — Other Ambulatory Visit (HOSPITAL_COMMUNITY): Payer: Self-pay

## 2022-02-28 MED ORDER — DESVENLAFAXINE SUCCINATE ER 50 MG PO TB24
50.0000 mg | ORAL_TABLET | Freq: Every day | ORAL | 0 refills | Status: DC
  Filled 2022-02-28 – 2022-03-27 (×3): qty 30, 30d supply, fill #0

## 2022-03-03 ENCOUNTER — Other Ambulatory Visit (HOSPITAL_COMMUNITY): Payer: Self-pay

## 2022-03-03 ENCOUNTER — Other Ambulatory Visit: Payer: Self-pay

## 2022-03-05 ENCOUNTER — Other Ambulatory Visit: Payer: Self-pay

## 2022-03-10 ENCOUNTER — Other Ambulatory Visit (HOSPITAL_COMMUNITY): Payer: Self-pay

## 2022-03-10 ENCOUNTER — Other Ambulatory Visit: Payer: Self-pay

## 2022-03-11 ENCOUNTER — Other Ambulatory Visit: Payer: Self-pay

## 2022-03-12 ENCOUNTER — Other Ambulatory Visit: Payer: Self-pay

## 2022-03-15 ENCOUNTER — Encounter: Payer: Self-pay | Admitting: Internal Medicine

## 2022-03-17 ENCOUNTER — Telehealth: Payer: Self-pay

## 2022-03-17 NOTE — Telephone Encounter (Signed)
PA approved on 03/13/2022 for Desvenlafaxine Succinate

## 2022-03-18 ENCOUNTER — Encounter: Payer: Self-pay | Admitting: Internal Medicine

## 2022-03-20 ENCOUNTER — Telehealth: Payer: Self-pay

## 2022-03-20 NOTE — Telephone Encounter (Signed)
Dia Sitter (Key: [id number])  form thumbnail [facility] is reviewing your PA request. Typically an electronic response will be received within 24-72 hours. To check for an update later, open this request from your dashboard.  You may close this dialog and return to your dashboard to perform other tasks.

## 2022-03-23 ENCOUNTER — Other Ambulatory Visit (HOSPITAL_COMMUNITY): Payer: Self-pay

## 2022-03-26 ENCOUNTER — Other Ambulatory Visit (HOSPITAL_COMMUNITY): Payer: Self-pay

## 2022-03-26 ENCOUNTER — Other Ambulatory Visit: Payer: Self-pay

## 2022-03-26 MED ORDER — TIRZEPATIDE 15 MG/0.5ML ~~LOC~~ SOAJ: 15.0000 mg | SUBCUTANEOUS | 2 refills | Status: DC

## 2022-03-27 ENCOUNTER — Other Ambulatory Visit (HOSPITAL_COMMUNITY): Payer: Self-pay

## 2022-03-27 ENCOUNTER — Other Ambulatory Visit: Payer: Self-pay

## 2022-03-27 MED ORDER — TIRZEPATIDE 15 MG/0.5ML ~~LOC~~ SOAJ: 15.0000 mg | SUBCUTANEOUS | 2 refills | Status: DC

## 2022-04-03 ENCOUNTER — Ambulatory Visit
Admission: RE | Admit: 2022-04-03 | Discharge: 2022-04-03 | Disposition: A | Discharge status: Home / Self Care | Payer: 59 | Source: Ambulatory Visit | Attending: Obstetrics & Gynecology | Admitting: Obstetrics & Gynecology

## 2022-04-03 DIAGNOSIS — Z1231 Encounter for screening mammogram for malignant neoplasm of breast: Secondary | ICD-10-CM

## 2022-04-09 ENCOUNTER — Encounter: Payer: Self-pay | Admitting: Internal Medicine

## 2022-04-09 ENCOUNTER — Other Ambulatory Visit: Payer: Self-pay

## 2022-04-09 MED ORDER — ROSUVASTATIN CALCIUM 5 MG PO TABS: 5.0000 mg | ORAL_TABLET | Freq: Every day | ORAL | 3 refills | Status: DC

## 2022-04-10 ENCOUNTER — Ambulatory Visit: Payer: 59 | Admitting: Obstetrics & Gynecology

## 2022-04-17 ENCOUNTER — Other Ambulatory Visit: Payer: Self-pay

## 2022-04-17 NOTE — Telephone Encounter (Signed)
Medication refill request: estradiol patch 0.'05mg'$  Last AEX:  04-01-21 with Dr Dellis Filbert Next AEX: not scheduled Last MMG (if hormonal medication request): 04-03-22 birads 1:neg Refill authorized: pharmacy note placed that patient needs to call to schedule yearly exam for future refills. Patient is requesting rx be [facility]. Please approve 39mh if appropriate. Routing to covering provider

## 2022-04-18 MED ORDER — ESTRADIOL 0.05 MG/24HR TD PTTW: 1.0000 | MEDICATED_PATCH | TRANSDERMAL | 0 refills | Status: DC

## 2022-04-20 ENCOUNTER — Other Ambulatory Visit: Payer: Self-pay

## 2022-04-20 ENCOUNTER — Other Ambulatory Visit: Payer: Self-pay | Admitting: *Deleted

## 2022-04-20 DIAGNOSIS — Z79890 Hormone replacement therapy (postmenopausal): Secondary | ICD-10-CM

## 2022-04-20 MED ORDER — ESTRADIOL 0.05 MG/24HR TD PTTW: 1.0000 | MEDICATED_PATCH | TRANSDERMAL | 0 refills | Status: DC

## 2022-04-20 MED ORDER — DESVENLAFAXINE SUCCINATE ER 50 MG PO TB24: 50.0000 mg | ORAL_TABLET | Freq: Every day | ORAL | 0 refills | Status: DC

## 2022-04-20 NOTE — Telephone Encounter (Signed)
ML pt inquired via mychart regarding estrogen patch refills. Pt notified that we received rx request on 04/17/22 and that at that time, her aex was not scheduled so provider only approved rx for one month. Pt is now scheduled for aex w/ ML on 06/24/2022 and is requesting that the initial one month refill be discontinued and for [place] to resend a 3 month supply. Please advise.  Mammo 04/03/2022-neg birads 1.

## 2022-04-23 ENCOUNTER — Other Ambulatory Visit: Payer: Self-pay | Admitting: Internal Medicine

## 2022-04-27 ENCOUNTER — Other Ambulatory Visit: Payer: Self-pay | Admitting: Internal Medicine

## 2022-05-07 ENCOUNTER — Other Ambulatory Visit: Payer: Self-pay | Admitting: Obstetrics and Gynecology

## 2022-05-07 DIAGNOSIS — Z79890 Hormone replacement therapy (postmenopausal): Secondary | ICD-10-CM

## 2022-05-07 NOTE — Telephone Encounter (Signed)
AEX 04/01/21 Scheduled 06/24/22  MMG 04/03/2022 negative.

## 2022-06-04 ENCOUNTER — Other Ambulatory Visit (HOSPITAL_COMMUNITY): Payer: Self-pay

## 2022-06-17 NOTE — Patient Instructions (Addendum)
      Blood work was ordered.   The lab is on the first floor.    Medications changes include :   none    A referral was ordered for XXX.     Someone will call you to schedule an appointment.    Return in about 6 months (around 12/18/2022) for Physical Exam.

## 2022-06-18 ENCOUNTER — Ambulatory Visit (INDEPENDENT_AMBULATORY_CARE_PROVIDER_SITE_OTHER): Payer: 59 | Admitting: Internal Medicine

## 2022-06-18 ENCOUNTER — Encounter: Payer: Self-pay | Admitting: Internal Medicine

## 2022-06-18 VITALS — BP 124/78 | HR 80 | Temp 98.2°F | Ht 65.5 in | Wt 184.0 lb

## 2022-06-18 DIAGNOSIS — E119 Type 2 diabetes mellitus without complications: Secondary | ICD-10-CM

## 2022-06-18 DIAGNOSIS — E7849 Other hyperlipidemia: Secondary | ICD-10-CM

## 2022-06-18 DIAGNOSIS — F3289 Other specified depressive episodes: Secondary | ICD-10-CM

## 2022-06-18 DIAGNOSIS — E785 Hyperlipidemia, unspecified: Secondary | ICD-10-CM | POA: Diagnosis not present

## 2022-06-18 DIAGNOSIS — F419 Anxiety disorder, unspecified: Secondary | ICD-10-CM

## 2022-06-18 DIAGNOSIS — K219 Gastro-esophageal reflux disease without esophagitis: Secondary | ICD-10-CM

## 2022-06-18 MED ORDER — ZEPBOUND 15 MG/0.5ML ~~LOC~~ SOAJ: 15.0000 mg | SUBCUTANEOUS | 3 refills | Status: DC

## 2022-06-18 NOTE — Assessment & Plan Note (Signed)
Chronic Regular exercise and healthy diet encouraged Check lipid panel  Continue Crestor 5 mg daily 

## 2022-06-18 NOTE — Assessment & Plan Note (Signed)
Chronic Controlled, Stable Continue Pristiq 50 mg daily 

## 2022-06-18 NOTE — Assessment & Plan Note (Addendum)
Chronic GERD controlled Continue Nexium 40 mg daily - try to decrease dose if possible

## 2022-06-18 NOTE — Assessment & Plan Note (Addendum)
Chronic   Lab Results  Component Value Date   HGBA1C 6.5 11/27/2021   Sugars controlled Check A1c Mounjaro 15 mg weekly not currently available - backorder Will try zepbound which she qualifies for - if not covered w/ savings card will need to try ozempic or Trulicity depending on availability Stressed regular exercise, diabetic diet

## 2022-06-18 NOTE — Progress Notes (Signed)
Subjective:    Patient ID: Courtney Tran, female    DOB: 05/08/1965, 57 y.o.   MRN: [id number]     HPI Courtney Tran is here for follow up of her chronic medical problems.  2 weeks w/o mounjaro.   Tends to get a headache with alcohol.    Walking at least 2 miles a day.   Medications and allergies reviewed with patient and updated if appropriate.  Current Outpatient Medications on File Prior to Visit  Medication Sig Dispense Refill   cholecalciferol (VITAMIN D) 1000 UNITS tablet Take 1,000 Units by mouth daily.     cyanocobalamin 100 MCG tablet Take 100 mcg by mouth daily.     desvenlafaxine (PRISTIQ) 50 MG 24 hr tablet TAKE 1 TABLET BY MOUTH DAILY 90 tablet 3   esomeprazole (NEXIUM) 40 MG capsule Take 1 capsule (40 mg total) by mouth daily before breakfast. 30 capsule 5   estradiol (DOTTI) 0.05 MG/24HR patch APPLY 1 PATCH TOPICALLY TO SKIN  TWICE WEEKLY 24 patch 0   Fexofenadine HCl (ALLEGRA ALLERGY PO) Take by mouth.     Prenatal Vit-Fe Fumarate-FA (PRENATAL MULTIVITAMIN) TABS tablet Take 1 tablet by mouth daily at 12 noon.     rosuvastatin (CRESTOR) 5 MG tablet Take 1 tablet (5 mg total) by mouth daily. 90 tablet 3   Thiamine HCl (VITAMIN B-1) 250 MG tablet Take 250 mg by mouth daily.     tirzepatide (MOUNJARO) 15 MG/0.5ML Pen Inject 15 mg into the skin once a week. 6 mL 1   vitamin E 100 UNIT capsule Take 100 Units by mouth.     No current facility-administered medications on file prior to visit.     Review of Systems  Constitutional:  Negative for fever.  Respiratory:  Negative for cough, shortness of breath and wheezing.   Cardiovascular:  Negative for chest pain, palpitations and leg swelling.  Gastrointestinal:  Negative for abdominal pain, constipation and nausea.  Neurological:  Negative for light-headedness and headaches.       Objective:   Vitals:   06/18/22 0854  BP: 124/78  Pulse: 80  Temp: 98.2 F (36.8 C)  SpO2: 96%   BP Readings from Last 3  Encounters:  06/18/22 124/78  11/27/21 122/78  04/01/21 110/80   Wt Readings from Last 3 Encounters:  06/18/22 184 lb (83.5 kg)  11/27/21 184 lb (83.5 kg)  04/01/21 200 lb (90.7 kg)   Body mass index is 30.15 kg/m.    Physical Exam Constitutional:      General: She is not in acute distress.    Appearance: Normal appearance.  HENT:     Head: Normocephalic and atraumatic.  Eyes:     Conjunctiva/sclera: Conjunctivae normal.  Cardiovascular:     Rate and Rhythm: Normal rate and regular rhythm.     Heart sounds: Normal heart sounds.  Pulmonary:     Effort: Pulmonary effort is normal. No respiratory distress.     Breath sounds: Normal breath sounds. No wheezing.  Musculoskeletal:     Cervical back: Neck supple.     Right lower leg: No edema.     Left lower leg: No edema.  Lymphadenopathy:     Cervical: No cervical adenopathy.  Skin:    General: Skin is warm and dry.     Findings: No rash.  Neurological:     Mental Status: She is alert. Mental status is at baseline.  Psychiatric:        Mood and  Affect: Mood normal.        Behavior: Behavior normal.        Lab Results  Component Value Date   WBC 7.9 11/27/2021   HGB 15.4 (H) 11/27/2021   HCT 44.5 11/27/2021   PLT 306.0 11/27/2021   GLUCOSE 107 (H) 11/27/2021   CHOL 216 (H) 11/27/2021   TRIG 205.0 (H) 11/27/2021   HDL 37.30 (L) 11/27/2021   LDLDIRECT 199.0 11/27/2021   LDLCALC 178 (H) 05/31/2014   ALT 43 (H) 11/27/2021   AST 25 11/27/2021   NA 138 11/27/2021   K 4.3 11/27/2021   CL 103 11/27/2021   CREATININE 0.96 11/27/2021   BUN 9 11/27/2021   CO2 28 11/27/2021   TSH 2.60 11/27/2021   HGBA1C 6.5 11/27/2021   MICROALBUR 2.0 (H) 11/27/2021     Assessment & Plan:    See Problem List for Assessment and Plan of chronic medical problems.

## 2022-06-24 ENCOUNTER — Encounter: Payer: Self-pay | Admitting: Obstetrics & Gynecology

## 2022-06-24 ENCOUNTER — Ambulatory Visit (INDEPENDENT_AMBULATORY_CARE_PROVIDER_SITE_OTHER): Payer: 59 | Admitting: Obstetrics & Gynecology

## 2022-06-24 VITALS — BP 110/70 | HR 85 | Ht 65.25 in | Wt 184.0 lb

## 2022-06-24 DIAGNOSIS — Z01419 Encounter for gynecological examination (general) (routine) without abnormal findings: Secondary | ICD-10-CM

## 2022-06-24 DIAGNOSIS — Z79890 Hormone replacement therapy (postmenopausal): Secondary | ICD-10-CM | POA: Diagnosis not present

## 2022-06-24 DIAGNOSIS — Z90710 Acquired absence of both cervix and uterus: Secondary | ICD-10-CM

## 2022-06-24 MED ORDER — ESTRADIOL 0.05 MG/24HR TD PTTW: MEDICATED_PATCH | TRANSDERMAL | 4 refills | Status: DC

## 2022-06-24 NOTE — Progress Notes (Signed)
SHANITHA BARTNIK 1965-04-28 [id number]   History:    57 y.o.  G0 Married.  Husband is a [employer].  Patient is not working.   RP:  Established patient presenting for annual gyn exam    HPI: Status post robotic TLH with bilateral salpingectomy in January 2015. Postmenopause, well on Estradiol patch 0.05 twice weekly.  No pelvic pain.  No pain with IC. Pap negative  03/2018. Urine and bowel movements normal.  Breasts normal, had a bilateral breast reduction in 01/2021.  Screening Mammo negative 03/2022.  Body mass index improved to 30.39.  Daily walks with her dog. Health labs with family physician.  Colono 10/2019 Benign polyps.  Fam h/o Colon Ca.      Past medical history,surgical history, family history and social history were all reviewed and documented in the EPIC chart.  Gynecologic History Patient's last menstrual period was 03/02/2013.  Obstetric History OB History  Gravida Para Term Preterm AB Living  0 0 0 0 0 0  SAB IAB Ectopic Multiple Live Births  0 0 0 0 0     ROS: A ROS was performed and pertinent positives and negatives are included in the history. GENERAL: No fevers or chills. HEENT: No change in vision, no earache, sore throat or sinus congestion. NECK: No pain or stiffness. CARDIOVASCULAR: No chest pain or pressure. No palpitations. PULMONARY: No shortness of breath, cough or wheeze. GASTROINTESTINAL: No abdominal pain, nausea, vomiting or diarrhea, melena or bright red blood per rectum. GENITOURINARY: No urinary frequency, urgency, hesitancy or dysuria. MUSCULOSKELETAL: No joint or muscle pain, no back pain, no recent trauma. DERMATOLOGIC: No rash, no itching, no lesions. ENDOCRINE: No polyuria, polydipsia, no heat or cold intolerance. No recent change in weight. HEMATOLOGICAL: No anemia or easy bruising or bleeding. NEUROLOGIC: No headache, seizures, numbness, tingling or weakness. PSYCHIATRIC: No depression, no loss of interest in normal activity or change in sleep pattern.      Exam:   BP 110/70   Pulse 85   Ht 5' 5.25" (1.657 m)   Wt 184 lb (83.5 kg)   LMP 03/02/2013 Comment: sexually active  SpO2 99%   BMI 30.39 kg/m   Body mass index is 30.39 kg/m.  General appearance : Well developed well nourished female. No acute distress HEENT: Eyes: no retinal hemorrhage or exudates,  Neck supple, trachea midline, no carotid bruits, no thyroidmegaly Lungs: Clear to auscultation, no rhonchi or wheezes, or rib retractions  Heart: Regular rate and rhythm, no murmurs or gallops Breast:Examined in sitting and supine position were symmetrical in appearance, no palpable masses or tenderness,  no skin retraction, no nipple inversion, no nipple discharge, no skin discoloration, no axillary or supraclavicular lymphadenopathy Abdomen: no palpable masses or tenderness, no rebound or guarding Extremities: no edema or skin discoloration or tenderness  Pelvic: Vulva: Normal             Vagina: No gross lesions or discharge  Cervix/Uterus absent  Adnexa  Without masses or tenderness  Anus: Normal   Assessment/Plan:  57 y.o. female for annual exam   1. Well female exam with routine gynecological exam Status post robotic TLH with bilateral salpingectomy in January 2015. Postmenopause, well on Estradiol patch 0.05 twice weekly.  No pelvic pain.  No pain with IC. Pap negative  03/2018. Urine and bowel movements normal.  Breasts normal, had a bilateral breast reduction in 01/2021.  Screening Mammo negative 03/2022.  Body mass index improved to 30.39.  Daily walks with her dog.  Health labs with family physician.  Colono 10/2019 Benign polyps.  Fam h/o Colon Ca.    2. S/P total hysterectomy  3. Postmenopausal hormone replacement therapy Status post robotic TLH with bilateral salpingectomy in January 2015. Postmenopause, well on Estradiol patch 0.05 twice weekly.  No pelvic pain.  No pain with IC.  - estradiol (DOTTI) 0.05 MG/24HR patch; APPLY 1 PATCH TOPICALLY TO SKIN  TWICE  WEEKLY   Genia Del MD, 11:15 AM

## 2022-07-10 ENCOUNTER — Other Ambulatory Visit: Payer: Self-pay

## 2022-07-10 ENCOUNTER — Encounter: Payer: Self-pay | Admitting: Internal Medicine

## 2022-07-10 ENCOUNTER — Other Ambulatory Visit (HOSPITAL_COMMUNITY): Payer: Self-pay

## 2022-07-10 MED ORDER — ZEPBOUND 15 MG/0.5ML ~~LOC~~ SOAJ
15.0000 mg | SUBCUTANEOUS | 3 refills | Status: DC
  Filled 2022-07-10: qty 2, 28d supply, fill #0

## 2022-07-11 ENCOUNTER — Other Ambulatory Visit (HOSPITAL_COMMUNITY): Payer: Self-pay

## 2022-07-23 ENCOUNTER — Telehealth: Payer: Self-pay | Admitting: *Deleted

## 2022-07-23 ENCOUNTER — Other Ambulatory Visit: Payer: Self-pay

## 2022-07-23 ENCOUNTER — Other Ambulatory Visit (HOSPITAL_COMMUNITY): Payer: Self-pay

## 2022-07-23 MED ORDER — ZEPBOUND 15 MG/0.5ML ~~LOC~~ SOAJ
15.0000 mg | SUBCUTANEOUS | 3 refills | Status: DC
  Filled 2022-07-23 – 2022-08-08 (×4): qty 2, 28d supply, fill #0

## 2022-07-23 NOTE — Telephone Encounter (Signed)
Prior auth started via cover my meds.  Awaiting determination.  Key: [id number]

## 2022-07-24 ENCOUNTER — Other Ambulatory Visit (HOSPITAL_COMMUNITY): Payer: Self-pay

## 2022-07-24 NOTE — Telephone Encounter (Signed)
Approved on May 9 Request Reference Number: [id number]. ZEPBOUND INJ 15/0.5ML is approved through 01/23/2023. Your patient may now fill this prescription and it will be covered. Authorization Expiration Date: 01/23/2023

## 2022-07-27 ENCOUNTER — Encounter: Payer: Self-pay | Admitting: Internal Medicine

## 2022-07-31 ENCOUNTER — Other Ambulatory Visit (HOSPITAL_COMMUNITY): Payer: Self-pay

## 2022-08-03 ENCOUNTER — Other Ambulatory Visit (HOSPITAL_COMMUNITY): Payer: Self-pay

## 2022-08-07 IMAGING — MG DIGITAL SCREENING BILAT W/ TOMO W/ CAD
8 series · 8 of 24 positions shown · non-contrast
Comparison: Previous exam(s).

CLINICAL DATA: Screening.

EXAM:
DIGITAL SCREENING BILATERAL MAMMOGRAM WITH TOMO AND CAD

[R CC synth-2D]
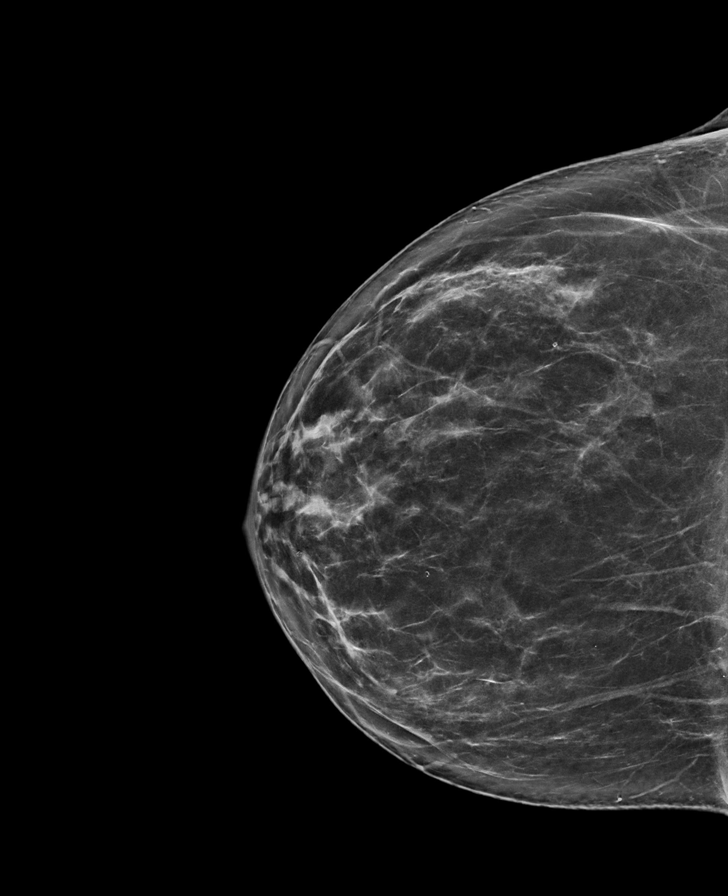

[R MLO synth-2D]
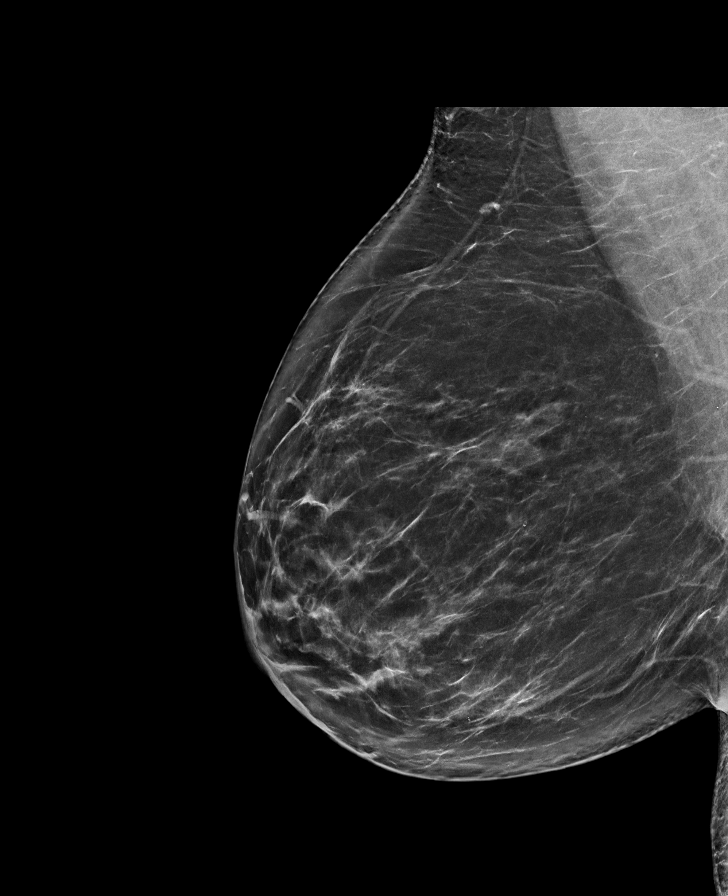

[L MLO synth-2D]
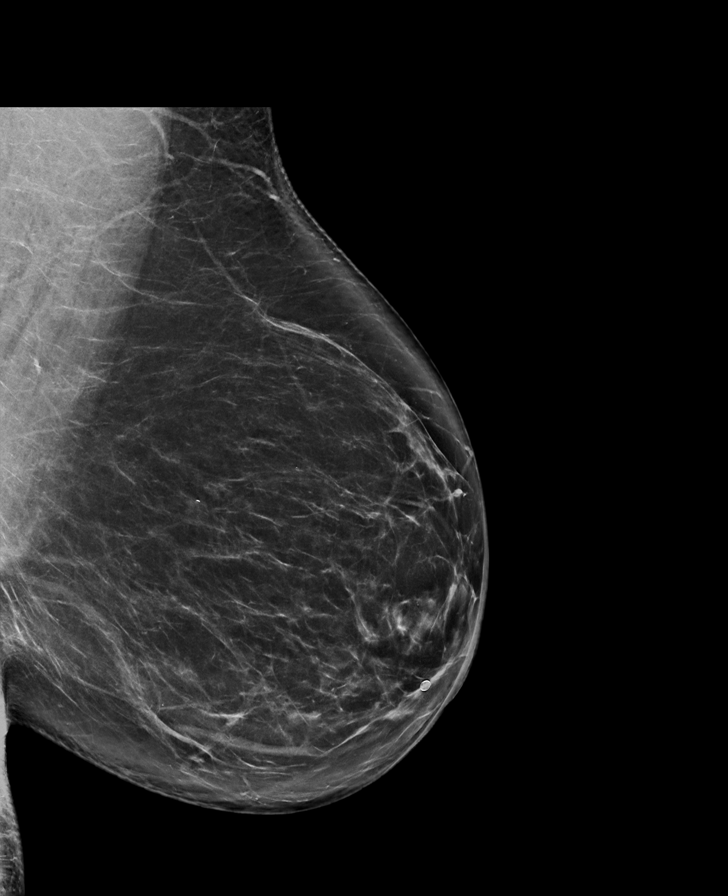

[L CC synth-2D]
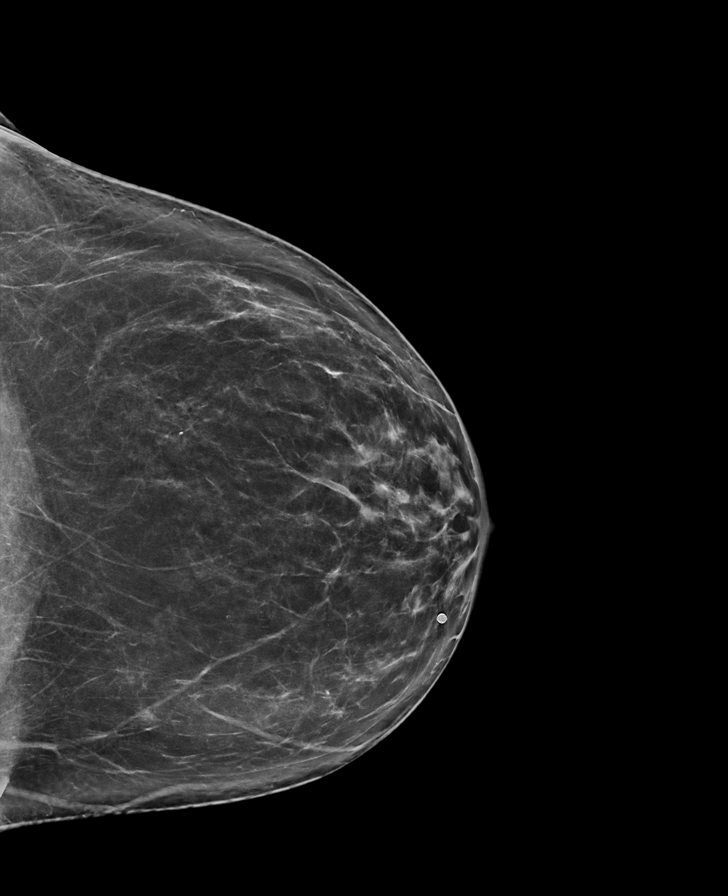

[R CC tomo · tomo slice 41/82.0]
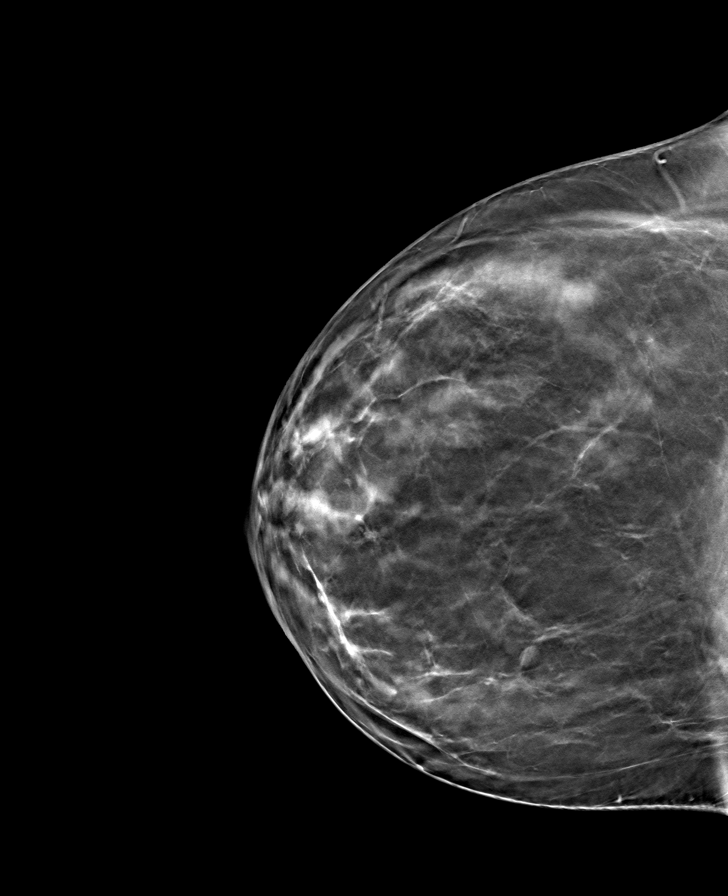

[R MLO tomo · tomo slice 47/94.0]
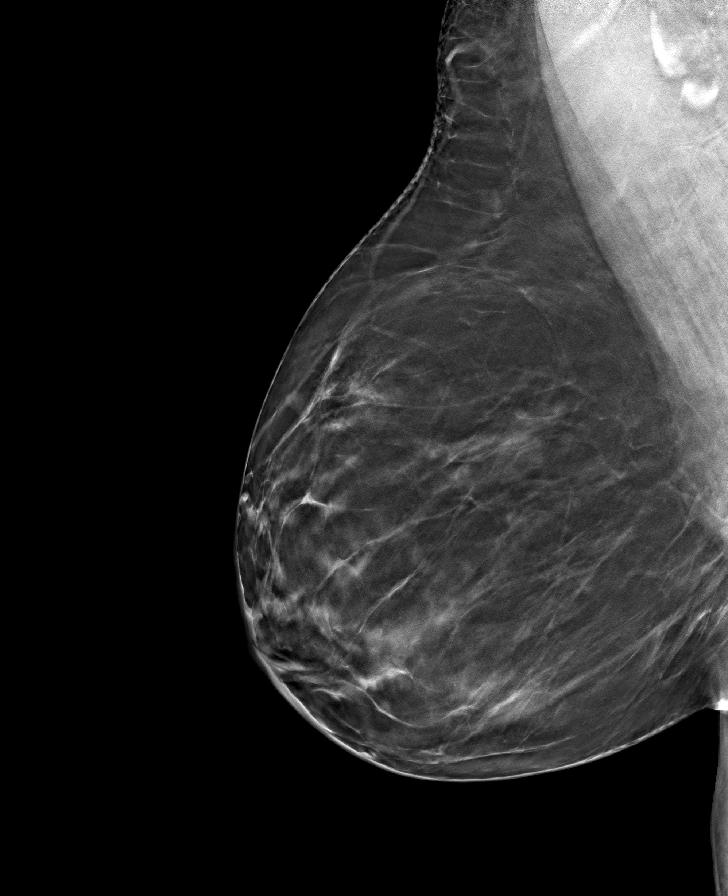

[L CC tomo · tomo slice 43/86.0]
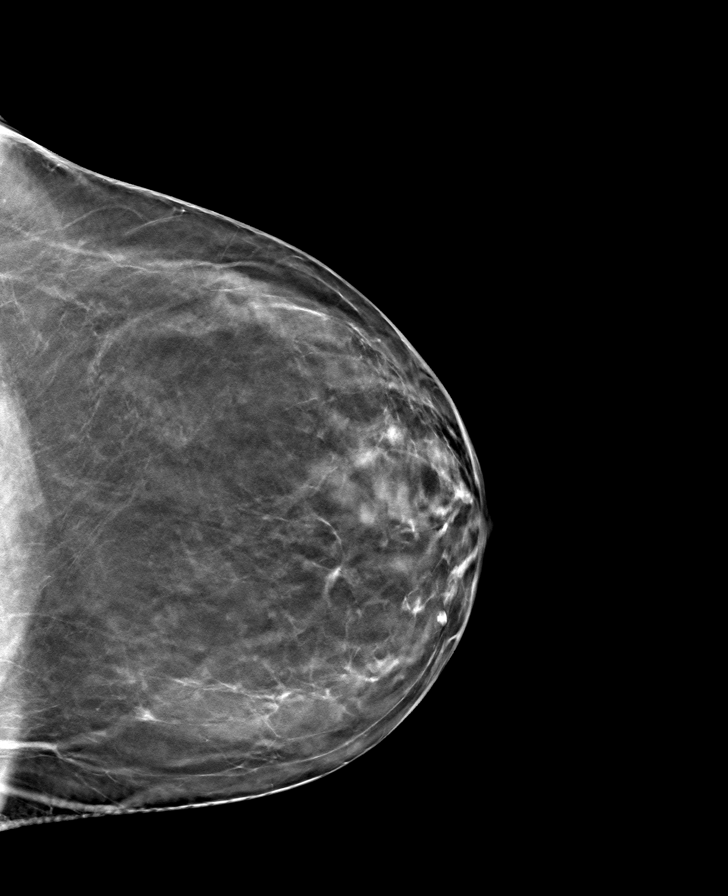

[L MLO tomo · tomo slice 48/95.0]
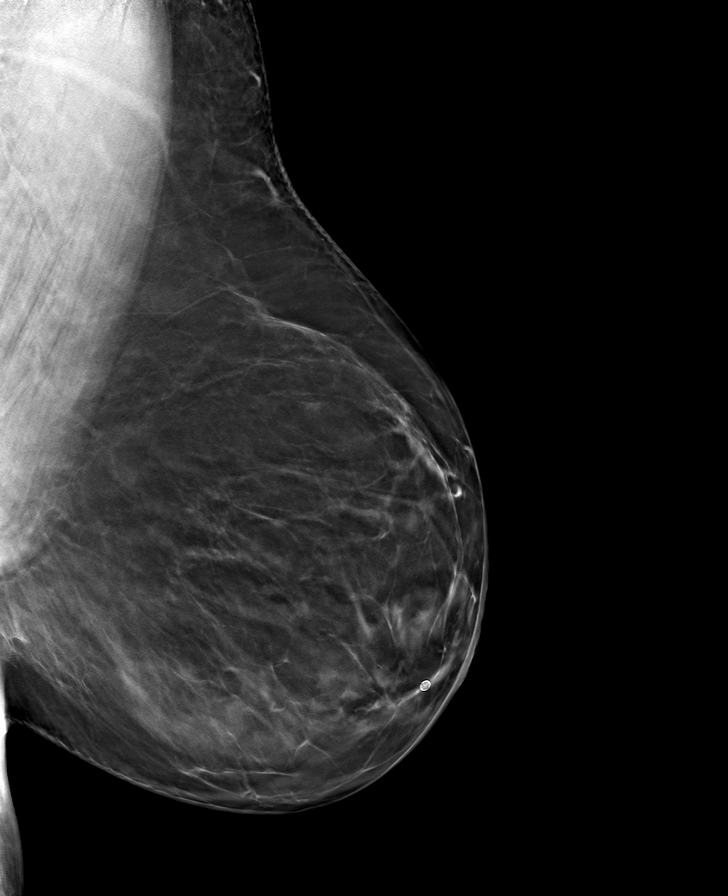

[8 of 24 positions shown; findings below may reference images not displayed]

ACR Breast Density Category b: There are scattered areas of
fibroglandular density.
FINDINGS: There are no findings suspicious for malignancy. Images were
processed with CAD.
IMPRESSION: No mammographic evidence of malignancy. A result letter of this
screening mammogram will be mailed directly to the patient.

RECOMMENDATION:
Screening mammogram in one year. (Code:[id number])

BI-RADS CATEGORY  1: Negative.

## 2022-08-08 ENCOUNTER — Other Ambulatory Visit (HOSPITAL_COMMUNITY): Payer: Self-pay

## 2022-08-12 LAB — HM DIABETES EYE EXAM

## 2022-08-13 LAB — HM DIABETES EYE EXAM

## 2022-10-03 ENCOUNTER — Encounter: Payer: Self-pay | Admitting: Internal Medicine

## 2022-10-06 ENCOUNTER — Ambulatory Visit (INDEPENDENT_AMBULATORY_CARE_PROVIDER_SITE_OTHER): Payer: 59 | Admitting: Sports Medicine

## 2022-10-06 VITALS — BP 122/78 | HR 99 | Ht 65.0 in | Wt 186.0 lb

## 2022-10-06 DIAGNOSIS — M25371 Other instability, right ankle: Secondary | ICD-10-CM

## 2022-10-06 DIAGNOSIS — M25561 Pain in right knee: Secondary | ICD-10-CM

## 2022-10-06 MED ORDER — MELOXICAM 15 MG PO TABS: 15.0000 mg | ORAL_TABLET | Freq: Every day | ORAL | 0 refills | Status: DC

## 2022-10-06 NOTE — Progress Notes (Signed)
Courtney Tran [name] [address] [place] [address] Phone: [phone]   Assessment and Plan:     1. 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  If still having pain after 2 weeks, complete 3rd-week of meloxicam. May use remaining meloxicam as needed once daily for pain control.  Do not to use additional NSAIDs while taking meloxicam.  May use Tylenol [phone] mg 2 to 3 times a day for breakthrough pain. - Start HEP for knee  2. Right ankle instability -Chronic with exacerbation, initial sports medicine visit - History of chronic right ankle inversion injuries without red flag symptoms - Start HEP for ankle strengthening - Recommend [employer] or other similar store for footwear and insert recommendations    Pertinent previous records reviewed include none   Follow Up: 4 weeks for reevaluation.  If no improvement or worsening of symptoms, would obtain right knee x-ray and could consider physical therapy versus CSI   Subjective:   I, Courtney Tran, am serving as a Neurosurgeon for Courtney Tran  Chief Complaint: right knee pain   HPI:   10/06/22 Patient is a 57 year old female complaining of right knee pain. Patient states the has hx of rolling her ankle/ leg. 2 weeks ago she had patellar tendon "meniscus window"  pain . RICES helped but this morning pain was back. She wants to make sure she isnt doing more damage. No meds or the pain. No radiating pain. Pain when she applies pressure to that leg. No catching or locking. Does endorse antalgic pain but that came along 2 days ago. [place] the [place]   Relevant Historical Information:   DM  Additional pertinent review of systems negative.   Current Outpatient Medications:    meloxicam (MOBIC) 15 MG tablet, Take 1 tablet (15 mg total) by mouth daily., Disp: 30 tablet, Rfl: 0   cholecalciferol (VITAMIN D) 1000 UNITS tablet, Take 1,000 Units by mouth daily., Disp: , Rfl:    cyanocobalamin 100 MCG tablet, Take 100 mcg by mouth daily., Disp: , Rfl:    desvenlafaxine (PRISTIQ) 50 MG 24 hr tablet, TAKE 1 TABLET BY MOUTH DAILY, Disp: 90 tablet, Rfl: 3   esomeprazole (NEXIUM) 40 MG capsule, Take 1 capsule (40 mg total) by mouth daily before breakfast., Disp: 30 capsule, Rfl: 5   estradiol (DOTTI) 0.05 MG/24HR patch, APPLY 1 PATCH TOPICALLY TO SKIN  TWICE WEEKLY, Disp: 24 patch, Rfl: 4   Fexofenadine HCl (ALLEGRA ALLERGY PO), Take by mouth., Disp: , Rfl:    Prenatal Vit-Fe Fumarate-FA (PRENATAL MULTIVITAMIN) TABS tablet, Take 1 tablet by mouth daily at 12 noon., Disp: , Rfl:    rosuvastatin (CRESTOR) 5 MG tablet, Take 1 tablet (5 mg total) by mouth daily., Disp: 90 tablet, Rfl: 3   Thiamine HCl (VITAMIN B-1) 250 MG tablet, Take 250 mg by mouth daily., Disp: , Rfl:    tirzepatide (MOUNJARO) 15 MG/0.5ML Pen, Inject 15 mg into the skin once a week., Disp: 6 mL, Rfl: 1   vitamin E 100 UNIT capsule, Take 100 Units by mouth., Disp: , Rfl:    Objective:     Vitals:   10/06/22 1301  BP: 122/78  Pulse: 99  SpO2: 99%  Weight: 186 lb (84.4 kg)  Height: 5\' 5"  (1.651 m)      Body mass index is 30.95 kg/m.    Physical Exam:    General:  awake, alert oriented, no acute distress nontoxic Skin: no suspicious lesions or rashes Neuro:sensation intact and strength 5/5 with no deficits, no atrophy, normal muscle tone Psych: No signs of anxiety, depression or other mood disorder  Right knee: No swelling No deformity Neg fluid wave, joint milking ROM Flex 110, Ext 0 NTTP over the quad tendon, medial fem condyle, lat fem condyle, patella, plica, patella tendon, tibial tuberostiy, fibular  head, posterior fossa, pes anserine bursa, gerdy's tubercle, medial jt line, lateral jt line Neg anterior and posterior drawer Neg lachman Neg sag sign Negative varus stress Negative valgus stress Negative McMurray Negative Thessaly  Gait normal    Electronically signed by:  Courtney Tran [name] 1:31 PM 10/06/22

## 2022-10-06 NOTE — Patient Instructions (Signed)
Knee HEP  - Start meloxicam 15 mg daily x2 weeks.  If still having pain after 2 weeks, complete 3rd-week of meloxicam. May use remaining meloxicam as needed once daily for pain control.  Do not to use additional NSAIDs while taking meloxicam.  May use Tylenol [phone] mg 2 to 3 times a day for breakthrough pain Can use [employer] or other similar stores for shoe rec 4 week follow up

## 2022-10-29 ENCOUNTER — Encounter: Payer: Self-pay | Admitting: Internal Medicine

## 2022-10-29 NOTE — Progress Notes (Signed)
Outside notes received. Information abstracted. Notes sent to scan.  

## 2022-10-30 NOTE — Progress Notes (Deleted)
    Aleen Sells [name] [address] [place] [address] Phone: [phone]   Assessment and Plan:     There are no diagnoses linked to this encounter.  ***   Pertinent previous records reviewed include ***   Follow Up: ***     Subjective:   I, Raford Brissett, am serving as a Neurosurgeon for Doctor Richardean Sale   Chief Complaint: right knee pain    HPI:    10/06/22 Patient is a 57 year old female complaining of right knee pain. Patient states the has hx of rolling her ankle/ leg. 2 weeks ago she had patellar tendon "meniscus window"  pain . RICES helped but this morning pain was back. She wants to make sure she isnt doing more damage. No meds or the pain. No radiating pain. Pain when she applies pressure to that leg. No catching or locking. Does endorse antalgic pain but that came along 2 days ago. [place]   11/03/2022 Patient states   Relevant Historical Information:  DM  Additional pertinent review of systems negative.   Current Outpatient Medications:    cholecalciferol (VITAMIN D) 1000 UNITS tablet, Take 1,000 Units by mouth daily., Disp: , Rfl:    cyanocobalamin 100 MCG tablet, Take 100 mcg by mouth daily., Disp: , Rfl:    desvenlafaxine (PRISTIQ) 50 MG 24 hr tablet, TAKE 1 TABLET BY MOUTH DAILY, Disp: 90 tablet, Rfl: 3   esomeprazole (NEXIUM) 40 MG capsule, Take 1 capsule (40 mg total) by mouth daily before breakfast., Disp: 30 capsule, Rfl: 5   estradiol (DOTTI) 0.05 MG/24HR patch, APPLY 1 PATCH TOPICALLY TO SKIN  TWICE WEEKLY, Disp: 24 patch, Rfl: 4   Fexofenadine HCl (ALLEGRA ALLERGY PO), Take by mouth., Disp: , Rfl:    meloxicam (MOBIC) 15 MG tablet, Take 1 tablet (15 mg total) by mouth daily., Disp: 30 tablet, Rfl: 0   Prenatal Vit-Fe Fumarate-FA (PRENATAL MULTIVITAMIN) TABS tablet, Take 1 tablet by mouth daily at 12 noon., Disp: , Rfl:    rosuvastatin (CRESTOR) 5 MG tablet, Take 1  tablet (5 mg total) by mouth daily., Disp: 90 tablet, Rfl: 3   Thiamine HCl (VITAMIN B-1) 250 MG tablet, Take 250 mg by mouth daily., Disp: , Rfl:    tirzepatide (MOUNJARO) 15 MG/0.5ML Pen, Inject 15 mg into the skin once a week., Disp: 6 mL, Rfl: 1   vitamin E 100 UNIT capsule, Take 100 Units by mouth., Disp: , Rfl:    Objective:     There were no vitals filed for this visit.    There is no height or weight on file to calculate BMI.    Physical Exam:    ***   Electronically signed by:  Aleen Sells [name] 7:12 AM 10/30/22

## 2022-11-03 ENCOUNTER — Ambulatory Visit: Payer: 59 | Admitting: Sports Medicine

## 2022-12-01 ENCOUNTER — Other Ambulatory Visit: Payer: Self-pay | Admitting: Internal Medicine

## 2022-12-28 ENCOUNTER — Other Ambulatory Visit (HOSPITAL_COMMUNITY): Payer: Self-pay

## 2022-12-28 ENCOUNTER — Telehealth: Payer: Self-pay

## 2022-12-28 NOTE — Telephone Encounter (Signed)
Pharmacy Patient Advocate Encounter   Received notification from CoverMyMeds that prior authorization for Zepbound is required/requested.   Insurance verification completed.   The patient is insured through [facility] .   Per test claim: PA required; PA submitted to [facility] via CoverMyMeds Key/confirmation #/EOC Key: [id number] Status is pending

## 2023-01-04 ENCOUNTER — Other Ambulatory Visit (HOSPITAL_COMMUNITY): Payer: Self-pay

## 2023-01-04 NOTE — Telephone Encounter (Signed)
Pharmacy Patient Advocate Encounter  Received notification from [facility] that Prior Authorization for Zepbound has been APPROVED from 10.14.24 to 4.15.25. Ran test claim, Copay is $24.99. This test claim was processed through [facility]- copay amounts may vary at other pharmacies due to pharmacy/plan contracts, or as the patient moves through the different stages of their insurance plan.   PA #/Case ID/Reference #: (Key: [id number])

## 2023-01-25 ENCOUNTER — Ambulatory Visit (INDEPENDENT_AMBULATORY_CARE_PROVIDER_SITE_OTHER): Payer: 59 | Admitting: Sports Medicine

## 2023-01-25 ENCOUNTER — Ambulatory Visit (INDEPENDENT_AMBULATORY_CARE_PROVIDER_SITE_OTHER): Payer: 59

## 2023-01-25 VITALS — BP 122/80 | HR 88 | Ht 65.0 in | Wt 186.0 lb

## 2023-01-25 DIAGNOSIS — M25561 Pain in right knee: Secondary | ICD-10-CM | POA: Diagnosis not present

## 2023-01-25 DIAGNOSIS — G8929 Other chronic pain: Secondary | ICD-10-CM

## 2023-01-25 MED ORDER — MELOXICAM 15 MG PO TABS: 15.0000 mg | ORAL_TABLET | Freq: Every day | ORAL | 0 refills | Status: DC

## 2023-01-25 NOTE — Progress Notes (Signed)
Courtney Tran [name] [address] [place] [address] Phone: [phone]   Assessment and Plan:     1. Chronic pain of right knee -Chronic with exacerbation, subsequent visit - Recurrent right knee pain, primarily medial.  At previous office visit, we suspected right knee pain was likely related to compensation with chronic right ankle sprains.  Patient has improved right ankle stability, and yet has had a recurrence flare of right knee pain without issue with right ankle.  I suspect patient's right knee pain is most likely due to pes anserine bursitis and inflammation of SGT tendons likely due to seated position while working from home.  Differential also includes mild meniscal pathology  - Start meloxicam 15 mg daily x2 weeks.  If still having pain after 2 weeks, complete 3rd-week of meloxicam. May use remaining meloxicam as needed once daily for pain control.  Do not to use additional NSAIDs while taking meloxicam.  May use Tylenol [phone] 2 to 3 times a day for breakthrough pain. - Start HEP for knee and pes anserine bursitis - Recommend adjusting seated position to decrease strain on medial knee - X-ray obtained in clinic.  My interpretation: No acute fracture or dislocation  15 additional minutes spent for educating Therapeutic Home Exercise Program.  This included exercises focusing on stretching, strengthening, with focus on eccentric aspects.   Long term goals include an improvement in range of motion, strength, endurance as well as avoiding reinjury. Patient's frequency would include in 1-2 times a day, 3-5 times a week for a duration of 6-12 weeks. Proper technique shown and discussed handout in great detail with ATC.  All questions were discussed and answered.    Pertinent previous records reviewed include none  Follow Up: 4 weeks for reevaluation.  If no improvement or worsening of symptoms, could consider pes anserine CSI versus  intra-articular CSI   Subjective:   I, Courtney Tran, am serving as a Neurosurgeon for Doctor Richardean Sale   Chief Complaint: right knee pain    HPI:    10/06/22 Patient is a 57 year old female complaining of right knee pain. Patient states the has hx of rolling her ankle/ leg. 2 weeks ago she had patellar tendon "meniscus window"  pain . RICES helped but this morning pain was back. She wants to make sure she isnt doing more damage. No meds or the pain. No radiating pain. Pain when she applies pressure to that leg. No catching or locking. Does endorse antalgic pain but that came along 2 days ago. [place] the [place]   01/25/2023 Patient states that her knee pain has gotten worse. Pain really began last Tuesday . Pennsaid has helped when she uses it. Side to side movement is painful and up and down steps    Relevant Historical Information:  DM  Additional pertinent review of systems negative.   Current Outpatient Medications:    cholecalciferol (VITAMIN D) 1000 UNITS tablet, Take 1,000 Units by mouth daily., Disp: , Rfl:    cyanocobalamin 100 MCG tablet, Take 100 mcg by mouth daily., Disp: , Rfl:    desvenlafaxine (PRISTIQ) 50 MG 24 hr tablet, TAKE 1 TABLET BY MOUTH DAILY, Disp: 90 tablet, Rfl: 3   esomeprazole (NEXIUM) 40 MG capsule, Take 1 capsule (40 mg total) by mouth daily before breakfast., Disp: 30 capsule, Rfl: 5   estradiol (DOTTI) 0.05 MG/24HR patch, APPLY 1 PATCH TOPICALLY TO SKIN  TWICE WEEKLY, Disp:  24 patch, Rfl: 4   Fexofenadine HCl (ALLEGRA ALLERGY PO), Take by mouth., Disp: , Rfl:    meloxicam (MOBIC) 15 MG tablet, Take 1 tablet (15 mg total) by mouth daily., Disp: 30 tablet, Rfl: 0   meloxicam (MOBIC) 15 MG tablet, Take 1 tablet (15 mg total) by mouth daily., Disp: 30 tablet, Rfl: 0   MOUNJARO 15 MG/0.5ML Pen, INJECT THE CONTENTS OF ONE PEN  SUBCUTANEOUSLY WEEKLY AS  DIRECTED, Disp: 6 mL, Rfl: 3   Prenatal Vit-Fe Fumarate-FA (PRENATAL MULTIVITAMIN)  TABS tablet, Take 1 tablet by mouth daily at 12 noon., Disp: , Rfl:    rosuvastatin (CRESTOR) 5 MG tablet, Take 1 tablet (5 mg total) by mouth daily., Disp: 90 tablet, Rfl: 3   Thiamine HCl (VITAMIN B-1) 250 MG tablet, Take 250 mg by mouth daily., Disp: , Rfl:    vitamin E 100 UNIT capsule, Take 100 Units by mouth., Disp: , Rfl:    Objective:     Vitals:   01/25/23 0759  BP: 122/80  Pulse: 88  SpO2: 99%  Weight: 186 lb (84.4 kg)  Height: 5\' 5"  (1.651 m)      Body mass index is 30.95 kg/m.    Physical Exam:    General:  awake, alert oriented, no acute distress nontoxic Skin: no suspicious lesions or rashes Neuro:sensation intact and strength 5/5 with no deficits, no atrophy, normal muscle tone Psych: No signs of anxiety, depression or other mood disorder   Right knee: No swelling No deformity Neg fluid wave, joint milking ROM Flex 110, Ext 0 TTP medial femoral condyle, pes anserine bursa NTTP over the quad tendon,  lat fem condyle, patella, plica, patella tendon, tibial tuberostiy, fibular head, posterior fossa,   gerdy's tubercle, medial jt line, lateral jt line Neg anterior and posterior drawer Neg lachman Neg sag sign Negative varus stress Negative valgus stress Negative McMurray for palpable pop, though reproduced medial knee pain   Gait normal       Electronically signed by:  Courtney Tran [name] 8:40 AM 01/25/23

## 2023-01-25 NOTE — Patient Instructions (Signed)
-   Start meloxicam 15 mg daily x2 weeks.  If still having pain after 2 weeks, complete 3rd-week of meloxicam. May use remaining meloxicam as needed once daily for pain control.  Do not to use additional NSAIDs while taking meloxicam.  May use Tylenol [phone] 2 to 3 times a day for breakthrough pain. Knee HEP  Be cautious with seated position 4 week follow up

## 2023-02-01 ENCOUNTER — Encounter (HOSPITAL_COMMUNITY): Payer: Self-pay | Admitting: Emergency Medicine

## 2023-02-01 ENCOUNTER — Emergency Department (HOSPITAL_COMMUNITY): Payer: 59

## 2023-02-01 ENCOUNTER — Encounter: Payer: Self-pay | Admitting: Sports Medicine

## 2023-02-01 ENCOUNTER — Other Ambulatory Visit: Payer: Self-pay

## 2023-02-01 ENCOUNTER — Emergency Department (HOSPITAL_COMMUNITY)
Admission: EM | Admit: 2023-02-01 | Discharge: 2023-02-01 | Disposition: A | Discharge status: Home / Self Care | Payer: 59 | Attending: Emergency Medicine | Admitting: Emergency Medicine

## 2023-02-01 DIAGNOSIS — M25561 Pain in right knee: Secondary | ICD-10-CM | POA: Insufficient documentation

## 2023-02-01 DIAGNOSIS — Z91040 Latex allergy status: Secondary | ICD-10-CM | POA: Diagnosis not present

## 2023-02-01 DIAGNOSIS — G8929 Other chronic pain: Secondary | ICD-10-CM | POA: Diagnosis not present

## 2023-02-01 MED ORDER — OXYCODONE-ACETAMINOPHEN 5-325 MG PO TABS
1.0000 | ORAL_TABLET | Freq: Once | ORAL | Status: AC
  Administered 2023-02-01: 1 via ORAL
  Filled 2023-02-01: qty 1

## 2023-02-01 NOTE — Discharge Instructions (Addendum)
It was a pleasure caring for you today.  As discussed, you will need to follow-up with your orthopedic provider tomorrow. Please do not bare weight on this leg until cleared by your ortho provider.   Seek emergency care if experiencing any new or worsening symptoms.  Alternating between 650 mg Tylenol and 400 mg Advil: The best way to alternate taking Acetaminophen (example Tylenol) and Ibuprofen (example Advil/Motrin) is to take them 3 hours apart. For example, if you take ibuprofen at 6 am you can then take Tylenol at 9 am. You can continue this regimen throughout the day, making sure you do not exceed the recommended maximum dose for each drug.

## 2023-02-01 NOTE — ED Provider Notes (Signed)
[facility]MENT AT [facility] Provider Note   CSN: [id number] Arrival date & time: 02/01/23  1653     History  Chief Complaint  Patient presents with   Knee Pain    Courtney Tran is a 57 y.o. female with PMHx DM, HLD, GERD, who presents to ED concerned for right knee pain. Patient does have chronic knee pain with this last flare starting 2 weeks ago. Does have close follow up with outpatient ortho provider. Patient stating that she felt a pop in this right knee while walking dog today and pain became more severe. Patient stating that she is not able to ambulate on this leg.  Denies fever, chest pain, dyspnea, cough, nausea, vomiting, diarrhea.    Knee Pain      Home Medications Prior to Admission medications   Medication Sig Start Date End Date Taking? Authorizing Provider  cholecalciferol (VITAMIN D) 1000 UNITS tablet Take 1,000 Units by mouth daily.    [provider]  cyanocobalamin 100 MCG tablet Take 100 mcg by mouth daily.    [provider]  desvenlafaxine (PRISTIQ) 50 MG 24 hr tablet TAKE 1 TABLET BY MOUTH DAILY 04/27/22   Pincus Sanes, MD  esomeprazole (NEXIUM) 40 MG capsule Take 1 capsule (40 mg total) by mouth daily before breakfast. 05/23/14   Newt Lukes, MD  estradiol (DOTTI) 0.05 MG/24HR patch APPLY 1 PATCH TOPICALLY TO SKIN  TWICE WEEKLY 06/24/22   Genia Del, MD  Fexofenadine HCl (ALLEGRA ALLERGY PO) Take by mouth.    [provider]  meloxicam (MOBIC) 15 MG tablet Take 1 tablet (15 mg total) by mouth daily. 10/06/22   Richardean Sale, DO  meloxicam (MOBIC) 15 MG tablet Take 1 tablet (15 mg total) by mouth daily. 01/25/23   Richardean Sale, DO  MOUNJARO 15 MG/0.5ML Pen INJECT THE CONTENTS OF ONE PEN  SUBCUTANEOUSLY WEEKLY AS  DIRECTED 12/02/22   Pincus Sanes, MD  Prenatal Vit-Fe Fumarate-FA (PRENATAL MULTIVITAMIN) TABS tablet Take 1 tablet by mouth daily at 12 noon.    [provider]  rosuvastatin (CRESTOR) 5 MG tablet Take 1 tablet (5 mg total) by mouth daily. 04/09/22   Pincus Sanes, MD  Thiamine HCl (VITAMIN B-1) 250 MG tablet Take 250 mg by mouth daily.    [provider]  vitamin E 100 UNIT capsule Take 100 Units by mouth.    [provider]      Allergies    Lactose and Latex    Review of Systems   Review of Systems  Musculoskeletal:        Knee pain    Physical Exam Updated Vital Signs BP (!) 145/96 (BP Location: Right Arm)   Pulse 91   Temp 98 F (36.7 C) (Oral)   Resp 18   Ht 5\' 5"  (1.651 m)   Wt 84.4 kg   LMP 03/02/2013 Comment: sexually active  SpO2 100%   BMI 30.95 kg/m  Physical Exam Vitals and nursing note reviewed.  Constitutional:      General: She is not in acute distress.    Appearance: She is not ill-appearing or toxic-appearing.  HENT:     Head: Normocephalic and atraumatic.  Eyes:     General: No scleral icterus.       Right eye: No discharge.        Left eye: No discharge.     Conjunctiva/sclera: Conjunctivae normal.  Cardiovascular:     Rate and  Rhythm: Normal rate.  Pulmonary:     Effort: Pulmonary effort is normal.  Abdominal:     General: Abdomen is flat.  Musculoskeletal:     Comments: +2 pedal pulses BL. Sensation to light touch intact. Right knee active ROM mildly limited d/t pain. No swelling, erythema, or increased warmth.  Skin:    General: Skin is warm and dry.  Neurological:     General: No focal deficit present.     Mental Status: She is alert. Mental status is at baseline.  Psychiatric:        Mood and Affect: Mood normal.        Behavior: Behavior normal.     ED Results / Procedures / Treatments   Labs (all labs ordered are listed, but only abnormal results are displayed) Labs Reviewed - No data to display  EKG None  Radiology No results found.  Procedures Procedures    Medications Ordered in ED Medications  oxyCODONE-acetaminophen (PERCOCET/ROXICET)  5-325 MG per tablet 1 tablet (1 tablet Oral Given 02/01/23 1916)    ED Course/ Medical Decision Making/ A&P                                 Medical Decision Making Amount and/or Complexity of Data Reviewed Radiology: ordered.   This patient presents to the ED for concern of knee pain, this involves an extensive number of treatment options, and is a complaint that carries with it a high risk of complications and morbidity.  The differential diagnosis includes hemarthrosis, gout, septic joint, fracture, muscle strain, compartment syndrome   Co morbidities that complicate the patient evaluation  DM, HLD, GERD   Additional history obtained:  PCP Dr. Lawerance Bach   Imaging Studies ordered:  I ordered imaging studies including  -knee xray: to assess for process contributing to patient's symptoms I independently visualized and interpreted imaging Shared findings with patient I agree with the radiologist interpretation   Problem List / ED Course / Critical interventions / Medication management  Patient presents to ED concern for right knee pain.  Patient stating that she felt a pop while walking her dog and immediately fell to the ground. States that she cannot ambulate on this knee. Denies pain anywhere else.  Denies any infectious complaints today.  Patient afebrile with stable vitals. Physical exam reassuring. Knee xray has not yet been read. Dr. Lockie Mola and I were able to view the xray and it does not appear to have any acute abnormalities. Patient stating that she would like to go home and follow up on xray results with her orthopedic provider tomorrow. I shared with patient that it is recommended to stay for entire medical workup - but I am reassured that patient can have close follow up with her ortho provider tomorrow. Patient already has knee brace. I will provide her with crutches. Patient educated on alternating Ibuprofen and Tylenol for pain management.  I have reviewed the  patients home medicines and have made adjustments as needed Patient afebrile with stable vitals.  Provided with return precautions.  Discharged in good condition.   Ddx these are considered less likely due to history of present illness and physical exam -hemarthrosis: joint without swelling; ROM intact -gout: no warmth or erythema; ROM intact  -septic joint: afebrile; no warmth or erythema; no skin changes; ROM intact  -fracture: xray without concern  -compartment syndrome: area not tense; neurovascularly intact   Social Determinants of  Health:  none          Final Clinical Impression(s) / ED Diagnoses Final diagnoses:  Acute pain of right knee    Rx / DC Orders ED Discharge Orders     None         Dorthy Cooler, [place] 02/01/23 1921    Virgina Norfolk, DO 02/01/23 2216

## 2023-02-01 NOTE — ED Triage Notes (Signed)
Patient arrives in wheelchair by POV c/o right knee pain. Patient states back in August hurt her knee while walking her dog and was seen and had treatment. About 2 weeks ago started having knee pain again. Was seen by [facility] and had evaluation. Today while walking dog she felt a pop in her knee and fell.

## 2023-02-02 ENCOUNTER — Ambulatory Visit (INDEPENDENT_AMBULATORY_CARE_PROVIDER_SITE_OTHER): Payer: 59 | Admitting: Sports Medicine

## 2023-02-02 VITALS — HR 85 | Ht 65.0 in | Wt 186.0 lb

## 2023-02-02 DIAGNOSIS — M25561 Pain in right knee: Secondary | ICD-10-CM | POA: Diagnosis not present

## 2023-02-02 DIAGNOSIS — M25361 Other instability, right knee: Secondary | ICD-10-CM

## 2023-02-02 DIAGNOSIS — G8929 Other chronic pain: Secondary | ICD-10-CM

## 2023-02-02 NOTE — Patient Instructions (Signed)
Thank you for coming in today  - goal should be pain-free ambulation.  If pain with weightbearing, recommend using crutches - May ice knee for the next 2 days and then transition to heating pads for tight and sore musculature -Continue meloxicam 15 mg daily x2 weeks.  If still having pain after 2 weeks, complete 3rd-week of meloxicam. May use remaining meloxicam as needed once daily for pain control.  Do not to use additional NSAIDs while taking meloxicam.  May use Tylenol [phone] mg 2 to 3 times a day for breakthrough pain. - We have ordered a right knee MRI.  Radiology in [place] will contact you to schedule an appointment.  Once you have your appointment scheduled, call our clinic back and schedule follow-up 5 days after MRI

## 2023-02-02 NOTE — Progress Notes (Signed)
Courtney Tran [name] [address] [place] [address] Phone: [phone]   Assessment and Plan:     1. Chronic pain of right knee 2. Knee instability, right - Chronic with exacerbation, subsequent sports medicine visit - Recurrent right knee pain with new episode of right knee popping and giving out while walking her dog resulting in painful weightbearing, right knee instability, right knee swelling. - Due to instability of right knee, recurrent severe pain in right knee, I recommend further evaluation for right knee MRI.  Concern for MCL versus medial meniscal pathology.  Differential includes pes anserine bursitis with strain of SGT tendons - Patient's goal should be pain-free ambulation.  If pain with weightbearing, recommend using crutches - May ice knee for the next 2 days and then transition to heating pads for tight and sore musculature -Continue meloxicam 15 mg daily x2 weeks.  If still having pain after 2 weeks, complete 3rd-week of meloxicam. May use remaining meloxicam as needed once daily for pain control.  Do not to use additional NSAIDs while taking meloxicam.  May use Tylenol [phone] mg 2 to 3 times a day for breakthrough pain.    Pertinent previous records reviewed include ER visit 02/01/2023, right knee x-ray 02/01/2023  Follow Up: 5 days after right knee MRI to review results and discuss treatment plan   Subjective:   I, Courtney Tran, am serving as a Neurosurgeon for Doctor Courtney Tran   Chief Complaint: right knee pain    HPI:    10/06/22 Patient is a 57 year old female complaining of right knee pain. Patient states the has hx of rolling her ankle/ leg. 2 weeks ago she had patellar tendon "meniscus window"  pain . RICES helped but this morning pain was back. She wants to make sure she isnt doing more damage. No meds or the pain. No radiating pain. Pain when she applies pressure to that leg. No catching or locking.  Does endorse antalgic pain but that came along 2 days ago. [place]    01/25/2023 Patient states that her knee pain has gotten worse. Pain really began last Tuesday . Pennsaid has helped when she uses it. Side to side movement is painful and up and down steps   02/02/2023 Patient states seen in ED 02/01/2023  Patient does have chronic knee pain with this last flare starting 2 weeks ago. Does have close follow up with outpatient ortho provider. Patient stating that she felt a pop in this right knee while walking dog today and pain became more severe. Patient stating that she is not able to ambulate on this leg. She is using crutches    Relevant Historical Information:  DM    Additional pertinent review of systems negative.   Current Outpatient Medications:    cholecalciferol (VITAMIN D) 1000 UNITS tablet, Take 1,000 Units by mouth daily., Disp: , Rfl:    cyanocobalamin 100 MCG tablet, Take 100 mcg by mouth daily., Disp: , Rfl:    desvenlafaxine (PRISTIQ) 50 MG 24 hr tablet, TAKE 1 TABLET BY MOUTH DAILY, Disp: 90 tablet, Rfl: 3   esomeprazole (NEXIUM) 40 MG capsule, Take 1 capsule (40 mg total) by mouth daily before breakfast., Disp: 30 capsule, Rfl: 5   estradiol (DOTTI) 0.05 MG/24HR patch, APPLY 1 PATCH TOPICALLY TO SKIN  TWICE WEEKLY, Disp: 24 patch, Rfl: 4   Fexofenadine HCl (ALLEGRA ALLERGY PO), Take by mouth., Disp: , Rfl:  meloxicam (MOBIC) 15 MG tablet, Take 1 tablet (15 mg total) by mouth daily., Disp: 30 tablet, Rfl: 0   meloxicam (MOBIC) 15 MG tablet, Take 1 tablet (15 mg total) by mouth daily., Disp: 30 tablet, Rfl: 0   MOUNJARO 15 MG/0.5ML Pen, INJECT THE CONTENTS OF ONE PEN  SUBCUTANEOUSLY WEEKLY AS  DIRECTED, Disp: 6 mL, Rfl: 3   Prenatal Vit-Fe Fumarate-FA (PRENATAL MULTIVITAMIN) TABS tablet, Take 1 tablet by mouth daily at 12 noon., Disp: , Rfl:    rosuvastatin (CRESTOR) 5 MG tablet, Take 1 tablet (5 mg total) by mouth daily., Disp: 90 tablet,  Rfl: 3   Thiamine HCl (VITAMIN B-1) 250 MG tablet, Take 250 mg by mouth daily., Disp: , Rfl:    vitamin E 100 UNIT capsule, Take 100 Units by mouth., Disp: , Rfl:    Objective:     Vitals:   02/02/23 1252  Pulse: 85  SpO2: 96%  Weight: 186 lb (84.4 kg)  Height: 5\' 5"  (1.651 m)      Body mass index is 30.95 kg/m.    Physical Exam:    General:  awake, alert oriented, no acute distress nontoxic Skin: no suspicious lesions or rashes Neuro:sensation intact and strength 5/5 with no deficits, no atrophy, normal muscle tone Psych: No signs of anxiety, depression or other mood disorder   Right knee: Mild swelling No deformity Positive fluid wave, joint milking ROM Flex 90, Ext 10 TTP medial femoral condyle, pes anserine bursa NTTP over the quad tendon,  lat fem condyle, patella, plica, patella tendon, tibial tuberostiy, fibular head, posterior fossa,   gerdy's tubercle, medial jt line, lateral jt line Neg anterior and posterior drawer Neg lachman Neg sag sign Negative varus stress Painful valgus stress over medial knee Negative McMurray for palpable pop, though reproduced medial knee pain   Pain with weightbearing on right leg, so patient using crutches    Electronically signed by:  Courtney Tran [name] 1:06 PM 02/02/23

## 2023-02-07 ENCOUNTER — Ambulatory Visit: Payer: 59

## 2023-02-07 DIAGNOSIS — M25361 Other instability, right knee: Secondary | ICD-10-CM

## 2023-02-07 DIAGNOSIS — M25561 Pain in right knee: Secondary | ICD-10-CM

## 2023-02-07 DIAGNOSIS — G8929 Other chronic pain: Secondary | ICD-10-CM

## 2023-02-09 ENCOUNTER — Other Ambulatory Visit: Payer: Self-pay | Admitting: Internal Medicine

## 2023-02-15 ENCOUNTER — Ambulatory Visit: Payer: 59 | Admitting: Sports Medicine

## 2023-02-22 ENCOUNTER — Ambulatory Visit: Payer: 59 | Admitting: Sports Medicine

## 2023-02-24 ENCOUNTER — Other Ambulatory Visit (INDEPENDENT_AMBULATORY_CARE_PROVIDER_SITE_OTHER): Payer: 59

## 2023-02-24 DIAGNOSIS — F419 Anxiety disorder, unspecified: Secondary | ICD-10-CM | POA: Diagnosis not present

## 2023-02-24 DIAGNOSIS — E785 Hyperlipidemia, unspecified: Secondary | ICD-10-CM

## 2023-02-24 DIAGNOSIS — E119 Type 2 diabetes mellitus without complications: Secondary | ICD-10-CM

## 2023-02-24 LAB — COMPREHENSIVE METABOLIC PANEL
ALT: 29 U/L (ref 0–35)
AST: 20 U/L (ref 0–37)
Albumin: 4.3 g/dL (ref 3.5–5.2)
Alkaline Phosphatase: 51 U/L (ref 39–117)
BUN: 9 mg/dL (ref 6–23)
CO2: 28 meq/L (ref 19–32)
Calcium: 9.6 mg/dL (ref 8.4–10.5)
Chloride: 106 meq/L (ref 96–112)
Creatinine, Ser: 0.92 mg/dL (ref 0.40–1.20)
GFR: 68.76 mL/min (ref >60.00)
Glucose, Bld: 92 mg/dL (ref 70–99)
Potassium: 3.7 meq/L (ref 3.5–5.1)
Sodium: 140 meq/L (ref 135–145)
Total Bilirubin: 0.5 mg/dL (ref 0.2–1.2)
Total Protein: 7 g/dL (ref 6.0–8.3)

## 2023-02-24 LAB — CBC WITH DIFFERENTIAL/PLATELET
Basophils Absolute: 0.1 10*3/uL (ref 0.0–0.1)
Basophils Relative: 0.9 % (ref 0.0–3.0)
Eosinophils Absolute: 0.2 10*3/uL (ref 0.0–0.7)
Eosinophils Relative: 3.5 % (ref 0.0–5.0)
HCT: 46 % (ref 36.0–46.0)
Hemoglobin: 15.9 g/dL — ABNORMAL HIGH (ref 12.0–15.0)
Lymphocytes Relative: 40.9 % (ref 12.0–46.0)
Lymphs Abs: 2.7 10*3/uL (ref 0.7–4.0)
MCHC: 34.6 g/dL (ref 30.0–36.0)
MCV: 92.1 fL (ref 78.0–100.0)
Monocytes Absolute: 0.5 10*3/uL (ref 0.1–1.0)
Monocytes Relative: 7.7 % (ref 3.0–12.0)
Neutro Abs: 3.1 10*3/uL (ref 1.4–7.7)
Neutrophils Relative %: 47 % (ref 43.0–77.0)
Platelets: 253 10*3/uL (ref 150.0–400.0)
RBC: 5 Mil/uL (ref 3.87–5.11)
RDW: 13 % (ref 11.5–15.5)
WBC: 6.5 10*3/uL (ref 4.0–10.5)

## 2023-02-24 LAB — HEMOGLOBIN A1C: Hgb A1c MFr Bld: 5.5 % (ref 4.6–6.5)

## 2023-02-24 LAB — LIPID PANEL
Cholesterol: 143 mg/dL (ref 0–200)
HDL: 48 mg/dL (ref >39.00)
LDL Cholesterol: 78 mg/dL (ref 0–99)
NonHDL: 94.71
Total CHOL/HDL Ratio: 3
Triglycerides: 86 mg/dL (ref 0.0–149.0)
VLDL: 17.2 mg/dL (ref 0.0–40.0)

## 2023-02-24 LAB — TSH: TSH: 2.49 u[IU]/mL (ref 0.35–5.50)

## 2023-02-25 ENCOUNTER — Encounter: Payer: Self-pay | Admitting: Internal Medicine

## 2023-02-25 NOTE — Progress Notes (Signed)
Subjective:    Patient ID: Courtney Tran, female    DOB: 09-05-1965, 57 y.o.   MRN: [id number]      HPI Courtney Tran is here for a Physical exam and her chronic medical problems.    Overall doing well.  No concerns.  Medications and allergies reviewed with patient and updated if appropriate.  Current Outpatient Medications on File Prior to Visit  Medication Sig Dispense Refill   cholecalciferol (VITAMIN D) 1000 UNITS tablet Take 1,000 Units by mouth daily.     cyanocobalamin 100 MCG tablet Take 100 mcg by mouth daily.     desoximetasone (TOPICORT) 0.25 % cream SMARTSIG:1 Topical Daily     desvenlafaxine (PRISTIQ) 50 MG 24 hr tablet TAKE 1 TABLET BY MOUTH DAILY 90 tablet 3   esomeprazole (NEXIUM) 40 MG capsule Take 1 capsule (40 mg total) by mouth daily before breakfast. 30 capsule 5   estradiol (DOTTI) 0.05 MG/24HR patch APPLY 1 PATCH TOPICALLY TO SKIN  TWICE WEEKLY 24 patch 4   Fexofenadine HCl (ALLEGRA ALLERGY PO) Take by mouth.     meloxicam (MOBIC) 15 MG tablet Take 1 tablet (15 mg total) by mouth daily. 30 tablet 0   MOUNJARO 15 MG/0.5ML Pen INJECT THE CONTENTS OF ONE PEN  SUBCUTANEOUSLY WEEKLY AS  DIRECTED 6 mL 3   Prenatal Vit-Fe Fumarate-FA (PRENATAL MULTIVITAMIN) TABS tablet Take 1 tablet by mouth daily at 12 noon.     rosuvastatin (CRESTOR) 5 MG tablet TAKE 1 TABLET BY MOUTH DAILY 90 tablet 0   Thiamine HCl (VITAMIN B-1) 250 MG tablet Take 250 mg by mouth daily.     vitamin E 100 UNIT capsule Take 100 Units by mouth.     No current facility-administered medications on file prior to visit.    Review of Systems  Constitutional:  Negative for fever.  Eyes:  Negative for visual disturbance.  Respiratory:  Negative for cough, shortness of breath and wheezing.   Cardiovascular:  Negative for chest pain, palpitations and leg swelling.  Gastrointestinal:  Negative for abdominal pain, blood in stool, constipation and diarrhea.       No gerd  Genitourinary:  Negative  for dysuria.  Musculoskeletal:  Positive for arthralgias (right kne - meniscus tear). Negative for back pain.  Skin:  Negative for rash.  Neurological:  Negative for light-headedness and headaches.  Psychiatric/Behavioral:  Positive for dysphoric mood (controlled). The patient is nervous/anxious (controlled).        Objective:   Vitals:   02/26/23 0804  BP: 124/80  Pulse: 70  Temp: 98.2 F (36.8 C)  SpO2: 97%   Filed Weights   02/26/23 0804  Weight: 184 lb (83.5 kg)   Body mass index is 30.62 kg/m.  BP Readings from Last 3 Encounters:  02/26/23 124/80  02/01/23 (!) 126/112  01/25/23 122/80    Wt Readings from Last 3 Encounters:  02/26/23 184 lb (83.5 kg)  02/02/23 186 lb (84.4 kg)  02/01/23 186 lb (84.4 kg)       Physical Exam Constitutional: She appears well-developed and well-nourished. No distress.  HENT:  Head: Normocephalic and atraumatic.  Right Ear: External ear normal. Normal ear canal and TM Left Ear: External ear normal.  Normal ear canal and TM Mouth/Throat: Oropharynx is clear and moist.  Eyes: Conjunctivae normal.  Neck: Neck supple. No tracheal deviation present. No thyromegaly present.  No carotid bruit  Cardiovascular: Normal rate, regular rhythm and normal heart sounds.   No murmur heard.  No  edema. Pulmonary/Chest: Effort normal and breath sounds normal. No respiratory distress. She has no wheezes. She has no rales.  Breast: deferred   Abdominal: Soft. She exhibits no distension. There is no tenderness.  Lymphadenopathy: She has no cervical adenopathy.  Skin: Skin is warm and dry. She is not diaphoretic.  Psychiatric: She has a normal mood and affect. Her behavior is normal.     Lab Results  Component Value Date   WBC 6.5 02/24/2023   HGB 15.9 (H) 02/24/2023   HCT 46.0 02/24/2023   PLT 253.0 02/24/2023   GLUCOSE 92 02/24/2023   CHOL 143 02/24/2023   TRIG 86.0 02/24/2023   HDL 48.00 02/24/2023   LDLDIRECT 199.0 11/27/2021    LDLCALC 78 02/24/2023   ALT 29 02/24/2023   AST 20 02/24/2023   NA 140 02/24/2023   K 3.7 02/24/2023   CL 106 02/24/2023   CREATININE 0.92 02/24/2023   BUN 9 02/24/2023   CO2 28 02/24/2023   TSH 2.49 02/24/2023   HGBA1C 5.5 02/24/2023   MICROALBUR 2.0 (H) 11/27/2021         Assessment & Plan:   Physical exam: Screening blood work  ordered Exercise  walks 2 mi   6 days a week Weight  working on weight loss Substance abuse  none   Reviewed recommended immunizations.   Health Maintenance  Topic Date Due   FOOT EXAM  Never done   Diabetic kidney evaluation - Urine ACR  11/28/2022   COVID-19 Vaccine (4 - 2024-25 season) 03/14/2023 (Originally 11/15/2022)   INFLUENZA VACCINE  06/14/2023 (Originally 10/15/2022)   OPHTHALMOLOGY EXAM  08/13/2023   HEMOGLOBIN A1C  08/25/2023   Diabetic kidney evaluation - eGFR measurement  02/24/2024   MAMMOGRAM  04/03/2024   Colonoscopy  11/05/2024   DTaP/Tdap/Td (3 - Td or Tdap) 05/16/2029   Hepatitis C Screening  Completed   HPV VACCINES  Aged Out   HIV Screening  Discontinued   Zoster Vaccines- Shingrix  Discontinued          See Problem List for Assessment and Plan of chronic medical problems.

## 2023-02-25 NOTE — Patient Instructions (Addendum)
[facility] [place] Phone : [phone]    Medications changes include :   None     Return in about 6 months (around 08/27/2023) for follow up.    Health Maintenance, Female Adopting a healthy lifestyle and getting preventive care are important in promoting health and wellness. Ask your health care provider about: The right schedule for you to have regular tests and exams. Things you can do on your own to prevent diseases and keep yourself healthy. What should I know about diet, weight, and exercise? Eat a healthy diet  Eat a diet that includes plenty of vegetables, fruits, low-fat dairy products, and lean protein. Do not eat a lot of foods that are high in solid fats, added sugars, or sodium. Maintain a healthy weight Body mass index (BMI) is used to identify weight problems. It estimates body fat based on height and weight. Your health care provider can help determine your BMI and help you achieve or maintain a healthy weight. Get regular exercise Get regular exercise. This is one of the most important things you can do for your health. Most adults should: Exercise for at least 150 minutes each week. The exercise should increase your heart rate and make you sweat (moderate-intensity exercise). Do strengthening exercises at least twice a week. This is in addition to the moderate-intensity exercise. Spend less time sitting. Even light physical activity can be beneficial. Watch cholesterol and blood lipids Have your blood tested for lipids and cholesterol at 58 years of age, then have this test every 5 years. Have your cholesterol levels checked more often if: Your lipid or cholesterol levels are high. You are older than 58 years of age. You are at high risk for heart disease. What should I know about cancer screening? Depending on your health history and family history, you may need to have cancer screening at various ages. This may include screening  for: Breast cancer. Cervical cancer. Colorectal cancer. Skin cancer. Lung cancer. What should I know about heart disease, diabetes, and high blood pressure? Blood pressure and heart disease High blood pressure causes heart disease and increases the risk of stroke. This is more likely to develop in people who have high blood pressure readings or are overweight. Have your blood pressure checked: Every 3-5 years if you are 38-76 years of age. Every year if you are 50 years old or older. Diabetes Have regular diabetes screenings. This checks your fasting blood sugar level. Have the screening done: Once every three years after age 26 if you are at a normal weight and have a low risk for diabetes. More often and at a younger age if you are overweight or have a high risk for diabetes. What should I know about preventing infection? Hepatitis B If you have a higher risk for hepatitis B, you should be screened for this virus. Talk with your health care provider to find out if you are at risk for hepatitis B infection. Hepatitis C Testing is recommended for: Everyone born from [birth year] through 1966. Anyone with known risk factors for hepatitis C. Sexually transmitted infections (STIs) Get screened for STIs, including gonorrhea and chlamydia, if: You are sexually active and are younger than 58 years of age. You are older than 58 years of age and your health care provider tells you that you are at risk for this type of infection. Your sexual activity has changed since you were last screened, and you are at increased risk for chlamydia or gonorrhea.  Ask your health care provider if you are at risk. Ask your health care provider about whether you are at high risk for HIV. Your health care provider may recommend a prescription medicine to help prevent HIV infection. If you choose to take medicine to prevent HIV, you should first get tested for HIV. You should then be tested every 3 months for as long as you  are taking the medicine. Pregnancy If you are about to stop having your period (premenopausal) and you may become pregnant, seek counseling before you get pregnant. Take 400 to 800 micrograms (mcg) of folic acid every day if you become pregnant. Ask for birth control (contraception) if you want to prevent pregnancy. Osteoporosis and menopause Osteoporosis is a disease in which the bones lose minerals and strength with aging. This can result in bone fractures. If you are 90 years old or older, or if you are at risk for osteoporosis and fractures, ask your health care provider if you should: Be screened for bone loss. Take a calcium or vitamin D supplement to lower your risk of fractures. Be given hormone replacement therapy (HRT) to treat symptoms of menopause. Follow these instructions at home: Alcohol use Do not drink alcohol if: Your health care provider tells you not to drink. You are pregnant, may be pregnant, or are planning to become pregnant. If you drink alcohol: Limit how much you have to: 0-1 drink a day. Know how much alcohol is in your drink. In the [place] one drink equals one 12 oz bottle of beer (355 mL), one 5 oz glass of wine (148 mL), or one 1 oz glass of hard liquor (44 mL). Lifestyle Do not use any products that contain nicotine or tobacco. These products include cigarettes, chewing tobacco, and vaping devices, such as e-cigarettes. If you need help quitting, ask your health care provider. Do not use street drugs. Do not share needles. Ask your health care provider for help if you need support or information about quitting drugs. General instructions Schedule regular health, dental, and eye exams. Stay current with your vaccines. Tell your health care provider if: You often feel depressed. You have ever been abused or do not feel safe at home. Summary Adopting a healthy lifestyle and getting preventive care are important in promoting health and wellness. Follow your  health care provider's instructions about healthy diet, exercising, and getting tested or screened for diseases. Follow your health care provider's instructions on monitoring your cholesterol and blood pressure. This information is not intended to replace advice given to you by your health care provider. Make sure you discuss any questions you have with your health care provider. Document Revised: 07/22/2020 Document Reviewed: 07/22/2020 Elsevier Patient Education  2024 [employer].

## 2023-02-26 ENCOUNTER — Ambulatory Visit (INDEPENDENT_AMBULATORY_CARE_PROVIDER_SITE_OTHER): Payer: 59 | Admitting: Internal Medicine

## 2023-02-26 VITALS — BP 124/80 | HR 70 | Temp 98.2°F | Ht 65.0 in | Wt 184.0 lb

## 2023-02-26 DIAGNOSIS — F419 Anxiety disorder, unspecified: Secondary | ICD-10-CM

## 2023-02-26 DIAGNOSIS — E6609 Other obesity due to excess calories: Secondary | ICD-10-CM

## 2023-02-26 DIAGNOSIS — E1169 Type 2 diabetes mellitus with other specified complication: Secondary | ICD-10-CM

## 2023-02-26 DIAGNOSIS — F3289 Other specified depressive episodes: Secondary | ICD-10-CM

## 2023-02-26 DIAGNOSIS — E785 Hyperlipidemia, unspecified: Secondary | ICD-10-CM | POA: Diagnosis not present

## 2023-02-26 DIAGNOSIS — Z7985 Long-term (current) use of injectable non-insulin antidiabetic drugs: Secondary | ICD-10-CM

## 2023-02-26 DIAGNOSIS — Z0000 Encounter for general adult medical examination without abnormal findings: Secondary | ICD-10-CM

## 2023-02-26 DIAGNOSIS — Z6830 Body mass index (BMI) 30.0-30.9, adult: Secondary | ICD-10-CM

## 2023-02-26 DIAGNOSIS — E66811 Obesity, class 1: Secondary | ICD-10-CM

## 2023-02-26 NOTE — Assessment & Plan Note (Signed)
Chronic   Lab Results  Component Value Date   HGBA1C 5.5 02/24/2023   Sugars controlled Check A1c, urine microalbumin Continue Mounjaro 15 mg weekly  regular exercise, diabetic diet

## 2023-02-26 NOTE — Assessment & Plan Note (Signed)
Chronic Regular exercise and healthy diet encouraged Check lipid panel, cmp, tsh, cbc Continue Crestor 5 mg daily

## 2023-02-26 NOTE — Assessment & Plan Note (Addendum)
Chronic Has lost weight-continues to work on weight loss Good protein intake, eating lots of veges On mounjaro 15 mg weekly walking

## 2023-02-26 NOTE — Assessment & Plan Note (Signed)
Chronic Controlled, Stable Continue Pristiq 50 mg daily 

## 2023-03-01 NOTE — Progress Notes (Signed)
Courtney Tran D.[facility] [address] [place] [address] Phone: [phone]   Assessment and Plan:     1. Chronic pain of right knee 2. Complex tear of medial meniscus of right knee as current injury, subsequent encounter  -Chronic with exacerbation, subsequent visit - Overall improvement in right knee pain after completing course of meloxicam 15 mg daily, continuing HEP, activity modification - Reviewed patient's MRI which showed complex tear of medial posterior meniscus, mild degenerative changes and inferior patellar pole and medial compartment, Hoffa's fat pad syndrome - I believed complex tear of medial posterior meniscus is patient's primary pain generator.  Reassuring that pain is overall well-controlled with no recent giving out episodes.  Patient is interested in discussing surgical options to resolve this issue and prevent recurrent pain.  She would prefer to discuss surgical options rather than proceed with intra-articular CSI.  Referral to orthopedic surgery placed today - May use Tylenol for day-to-day pain relief - Discontinue meloxicam 15 mg daily.  May use remaining medication meloxicam 15 mg daily as needed for breakthrough pain.  Recommend limiting chronic NSAIDs to 1-2 doses per week - Continue HEP  Pertinent previous records reviewed include right knee MRI 02/23/2023  Follow Up: As needed   Subjective:   I, Courtney Tran, am serving as a Neurosurgeon for Doctor Courtney Tran   Chief Complaint: right knee pain    HPI:    10/06/22 Patient is a 57 year old female complaining of right knee pain. Patient states the has hx of rolling her ankle/ leg. 2 weeks ago she had patellar tendon "meniscus window"  pain . RICES helped but this morning pain was back. She wants to make sure she isnt doing more damage. No meds or the pain. No radiating pain. Pain when she applies pressure to that leg. No catching or locking. Does endorse  antalgic pain but that came along 2 days ago. [place]    01/25/2023 Patient states that her knee pain has gotten worse. Pain really began last Tuesday . Pennsaid has helped when she uses it. Side to side movement is painful and up and down steps    02/02/2023 Patient states seen in ED 02/01/2023  Patient does have chronic knee pain with this last flare starting 2 weeks ago. Does have close follow up with outpatient ortho provider. Patient stating that she felt a pop in this right knee while walking dog today and pain became more severe. Patient stating that she is not able to ambulate on this leg. She is using crutches   03/02/2023 Patient states that she is a lot better .   Relevant Historical Information:  DM  Additional pertinent review of systems negative.   Current Outpatient Medications:    cholecalciferol (VITAMIN D) 1000 UNITS tablet, Take 1,000 Units by mouth daily., Disp: , Rfl:    cyanocobalamin 100 MCG tablet, Take 100 mcg by mouth daily., Disp: , Rfl:    desoximetasone (TOPICORT) 0.25 % cream, SMARTSIG:1 Topical Daily, Disp: , Rfl:    desvenlafaxine (PRISTIQ) 50 MG 24 hr tablet, TAKE 1 TABLET BY MOUTH DAILY, Disp: 90 tablet, Rfl: 3   esomeprazole (NEXIUM) 40 MG capsule, Take 1 capsule (40 mg total) by mouth daily before breakfast., Disp: 30 capsule, Rfl: 5   estradiol (DOTTI) 0.05 MG/24HR patch, APPLY 1 PATCH TOPICALLY TO SKIN  TWICE WEEKLY, Disp: 24 patch, Rfl: 4   Fexofenadine HCl (ALLEGRA ALLERGY PO), Take  by mouth., Disp: , Rfl:    meloxicam (MOBIC) 15 MG tablet, Take 1 tablet (15 mg total) by mouth daily., Disp: 30 tablet, Rfl: 0   MOUNJARO 15 MG/0.5ML Pen, INJECT THE CONTENTS OF ONE PEN  SUBCUTANEOUSLY WEEKLY AS  DIRECTED, Disp: 6 mL, Rfl: 3   Prenatal Vit-Fe Fumarate-FA (PRENATAL MULTIVITAMIN) TABS tablet, Take 1 tablet by mouth daily at 12 noon., Disp: , Rfl:    rosuvastatin (CRESTOR) 5 MG tablet, TAKE 1 TABLET BY MOUTH DAILY, Disp: 90  tablet, Rfl: 0   Thiamine HCl (VITAMIN B-1) 250 MG tablet, Take 250 mg by mouth daily., Disp: , Rfl:    vitamin E 100 UNIT capsule, Take 100 Units by mouth., Disp: , Rfl:    Objective:     Vitals:   03/02/23 1413  BP: 124/82  Pulse: (!) 101  SpO2: 97%  Weight: 189 lb (85.7 kg)  Height: 5\' 5"  (1.651 m)      Body mass index is 31.45 kg/m.    Physical Exam:    General:  awake, alert oriented, no acute distress nontoxic Skin: no suspicious lesions or rashes Neuro:sensation intact and strength 5/5 with no deficits, no atrophy, normal muscle tone Psych: No signs of anxiety, depression or other mood disorder   Right knee: Mild swelling No deformity Negative fluid wave, joint milking ROM Flex 90, Ext 10 Mild TTP medial femoral condyle,   NTTP over the quad tendon,  lat fem condyle, patella, plica, patella tendon, tibial tuberostiy, fibular head, posterior fossa,   gerdy's tubercle, medial jt line, lateral jt line Neg anterior and posterior drawer Neg lachman Neg sag sign Negative varus stress Mildly painful valgus stress over medial knee Negative McMurray for palpable pop, though reproduced medial knee pain    Electronically signed by:  Courtney Tran D.[facility] 3:41 PM 03/02/23

## 2023-03-02 ENCOUNTER — Ambulatory Visit: Payer: 59 | Admitting: Sports Medicine

## 2023-03-02 VITALS — BP 124/82 | HR 101 | Ht 65.0 in | Wt 189.0 lb

## 2023-03-02 DIAGNOSIS — S83231D Complex tear of medial meniscus, current injury, right knee, subsequent encounter: Secondary | ICD-10-CM

## 2023-03-02 DIAGNOSIS — G8929 Other chronic pain: Secondary | ICD-10-CM | POA: Diagnosis not present

## 2023-03-02 DIAGNOSIS — M25561 Pain in right knee: Secondary | ICD-10-CM

## 2023-03-02 NOTE — Patient Instructions (Signed)
Ortho referral  Discontinue meloxicam and use remainder as need Tylenol for day to day pain  As needed follow

## 2023-03-25 ENCOUNTER — Encounter: Payer: Self-pay | Admitting: Sports Medicine

## 2023-03-25 ENCOUNTER — Other Ambulatory Visit: Payer: Self-pay | Admitting: Sports Medicine

## 2023-04-03 ENCOUNTER — Other Ambulatory Visit: Payer: Self-pay | Admitting: Internal Medicine

## 2023-04-13 ENCOUNTER — Telehealth: Payer: Self-pay

## 2023-04-13 NOTE — Telephone Encounter (Signed)
Copied from CRM [id number]. Topic: General - Other >> Apr 12, 2023  2:59 PM Denese Killings wrote: Reason for CRM: Lawson Fiscal with [facility] states that she does not need the medical clearance anymore for surgery. She states it was sent in error.

## 2023-05-24 ENCOUNTER — Other Ambulatory Visit: Payer: Self-pay | Admitting: Internal Medicine

## 2023-05-27 ENCOUNTER — Encounter: Payer: Self-pay | Admitting: Internal Medicine

## 2023-05-28 ENCOUNTER — Telehealth: Payer: Self-pay

## 2023-05-31 ENCOUNTER — Other Ambulatory Visit (HOSPITAL_COMMUNITY): Payer: Self-pay

## 2023-05-31 ENCOUNTER — Telehealth: Payer: Self-pay

## 2023-05-31 NOTE — Telephone Encounter (Signed)
 Pharmacy Patient Advocate Encounter   Received notification from Pt Calls Messages that prior authorization for Mounjaro 15 is required/requested.   Insurance verification completed.   The patient is insured through [facility] .   Per test claim: PA required; PA submitted to above mentioned insurance via CoverMyMeds Key/confirmation #/EOC [id number] Status is pending

## 2023-06-01 ENCOUNTER — Telehealth: Payer: Self-pay

## 2023-06-01 ENCOUNTER — Other Ambulatory Visit (HOSPITAL_COMMUNITY): Payer: Self-pay

## 2023-06-01 NOTE — Telephone Encounter (Signed)
 Pharmacy Patient Advocate Encounter  Received notification from [facility] that Prior Authorization for [facility] 15 has been DENIED.  Full denial letter will be uploaded to the media tab. See denial reason below.   PA #/Case ID/Reference #:  [id number]

## 2023-06-04 NOTE — Telephone Encounter (Signed)
 Mychart message sent to patient to see if she can contact insurance and find out what is needed.

## 2023-06-08 ENCOUNTER — Other Ambulatory Visit: Payer: Self-pay | Admitting: Obstetrics & Gynecology

## 2023-06-08 DIAGNOSIS — Z79890 Hormone replacement therapy (postmenopausal): Secondary | ICD-10-CM

## 2023-06-09 NOTE — Telephone Encounter (Signed)
 Med refill request: HRT Patch Last AEX: 06/24/2022--ML Next AEX: 06/28/2023-GH Last MMG (if hormonal med): 04/03/2022-WNL Refill authorized: rx pend.

## 2023-06-18 ENCOUNTER — Other Ambulatory Visit (HOSPITAL_COMMUNITY): Payer: Self-pay

## 2023-06-28 ENCOUNTER — Encounter: Payer: Self-pay | Admitting: Obstetrics and Gynecology

## 2023-06-28 ENCOUNTER — Ambulatory Visit (INDEPENDENT_AMBULATORY_CARE_PROVIDER_SITE_OTHER): Payer: 59 | Admitting: Obstetrics and Gynecology

## 2023-06-28 ENCOUNTER — Other Ambulatory Visit: Payer: Self-pay | Admitting: Obstetrics and Gynecology

## 2023-06-28 VITALS — BP 122/78 | HR 80 | Temp 97.6°F | Ht 66.5 in | Wt 181.0 lb

## 2023-06-28 DIAGNOSIS — Z90710 Acquired absence of both cervix and uterus: Secondary | ICD-10-CM | POA: Insufficient documentation

## 2023-06-28 DIAGNOSIS — Z01419 Encounter for gynecological examination (general) (routine) without abnormal findings: Secondary | ICD-10-CM | POA: Diagnosis not present

## 2023-06-28 DIAGNOSIS — Z1331 Encounter for screening for depression: Secondary | ICD-10-CM

## 2023-06-28 DIAGNOSIS — Z79890 Hormone replacement therapy (postmenopausal): Secondary | ICD-10-CM | POA: Diagnosis not present

## 2023-06-28 DIAGNOSIS — Z1231 Encounter for screening mammogram for malignant neoplasm of breast: Secondary | ICD-10-CM

## 2023-06-28 MED ORDER — ESTRADIOL 0.05 MG/24HR TD PTTW: 1.0000 | MEDICATED_PATCH | TRANSDERMAL | 3 refills | Status: AC

## 2023-06-28 NOTE — Assessment & Plan Note (Signed)
 Cervical cancer screening performed according to ASCCP guidelines. Encouraged annual mammogram screening Colonoscopy UTD DXA N/A Labs and immunizations with her primary Encouraged safe sexual practices as indicated Encouraged healthy lifestyle practices with diet and exercise For patients under 50-59yo, I recommend 1200mg  calcium daily and 600IU of vitamin D daily.

## 2023-06-28 NOTE — Patient Instructions (Signed)

## 2023-06-28 NOTE — Progress Notes (Signed)
 58 y.o. G0P0000 female s/p RA TLH, BS (2015) on HRT here for annual exam. Married.  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  Doing well on estradiol patch.  Has questions about testosterone replacement.  Undecided at this time.  Patient's last menstrual period was 03/02/2013.   Abnormal bleeding: None Pelvic discharge or pain: None Last PAP: No results found for: "DIAGPAP", "HPVHIGH", "ADEQPAP" Last mammogram: 04/03/2022 BI-RADS 1, density B Last colonoscopy: 10/2019 Benign polyps.  Fam h/o Colon Ca.    Sexually active: yes  Exercising: Walks dog, 2 miles a day Smoker: No  Garment/textile technologist Visit from 06/28/2023 in [facility]  PHQ-2 Total Score 0       GYN HISTORY: RA TLH, 2015  OB History  Gravida Para Term Preterm AB Living  0 0 0 0 0 0  SAB IAB Ectopic Multiple Live Births  0 0 0 0 0    Past Medical History:  Diagnosis Date   Allergy    Basal cell carcinoma, face 2013   s/p excision, multiple basal cells   DEPRESSION    Diabetes mellitus without complication (HCC)    Dyslipidemia    Esophageal reflux    Esophageal stricture 08/2005   EGD with dilation    GALLSTONES    Obesity, unspecified    Rosacea    Seasonal allergies    Uterine fibroid     Past Surgical History:  Procedure Laterality Date   ABDOMINAL HYSTERECTOMY  03/06/2013   BREAST REDUCTION SURGERY  01/16/2021   COLON RESECTION     colon resection when pt was infant   COLONOSCOPY     EXTERNAL EAR SURGERY     POLYPECTOMY     REDUCTION MAMMAPLASTY     ROBOTIC ASSISTED TOTAL HYSTERECTOMY N/A 03/06/2013   Procedure: ROBOTIC ASSISTED TOTAL HYSTERECTOMY WITH BILATERAL SALPINGECTOMY;  Surgeon: Genia Del, MD;  Location: [facility];  Service: Gynecology;  Laterality: N/A;   Ruptured bowel repair  1967   UPPER GASTROINTESTINAL ENDOSCOPY     WISDOM TOOTH EXTRACTION      Current Outpatient Medications on File Prior to Visit  Medication  Sig Dispense Refill   cholecalciferol (VITAMIN D) 1000 UNITS tablet Take 1,000 Units by mouth daily.     cyanocobalamin 100 MCG tablet Take 100 mcg by mouth daily.     desoximetasone (TOPICORT) 0.25 % cream SMARTSIG:1 Topical Daily     desvenlafaxine (PRISTIQ) 50 MG 24 hr tablet TAKE 1 TABLET BY MOUTH DAILY 90 tablet 3   esomeprazole (NEXIUM) 40 MG capsule Take 1 capsule (40 mg total) by mouth daily before breakfast. 30 capsule 5   Fexofenadine HCl (ALLEGRA ALLERGY PO) Take by mouth.     MOUNJARO 15 MG/0.5ML Pen INJECT THE CONTENTS OF ONE PEN  SUBCUTANEOUSLY WEEKLY AS  DIRECTED 6 mL 3   Prenatal Vit-Fe Fumarate-FA (PRENATAL MULTIVITAMIN) TABS tablet Take 1 tablet by mouth daily at 12 noon.     rosuvastatin (CRESTOR) 5 MG tablet TAKE 1 TABLET BY MOUTH DAILY 90 tablet 1   Thiamine HCl (VITAMIN B-1) 250 MG tablet Take 250 mg by mouth daily.     vitamin E 100 UNIT capsule Take 100 Units by mouth.     No current facility-administered medications on file prior to visit.    Social History   Socioeconomic History   Marital status: Married    Spouse name: Not on file   Number of children: Not on file  Years of education: Not on file   Highest education level: Master's degree (e.g., MA, MS, MEng, MEd, MSW, MBA)  Occupational History   Not on file  Tobacco Use   Smoking status: Never   Smokeless tobacco: Never  Vaping Use   Vaping status: Never Used  Substance and Sexual Activity   Alcohol use: Yes    Alcohol/week: 2.0 standard drinks of alcohol    Types: 2 Standard drinks or equivalent per week    Comment: weekends   Drug use: No   Sexual activity: Yes    Partners: Male    Birth control/protection: Surgical    Comment: 1st intercourse- 20, partners- 5,hysterectomy  Other Topics Concern   Not on file  Social History Narrative   Not on file   Social Drivers of Health   Financial Resource Strain: Low Risk  (02/22/2023)   Overall Financial Resource Strain (CARDIA)    Difficulty of  Paying Living Expenses: Not hard at all  Food Insecurity: No Food Insecurity (02/22/2023)   Hunger Vital Sign    Worried About Running Out of Food in the Last Year: Never true    Ran Out of Food in the Last Year: Never true  Transportation Needs: No Transportation Needs (02/22/2023)   PRAPARE - Administrator, Civil Service (Medical): No    Lack of Transportation (Non-Medical): No  Physical Activity: Sufficiently Active (02/22/2023)   Exercise Vital Sign    Days of Exercise per Week: 6 days    Minutes of Exercise per Session: 40 min  Stress: Stress Concern Present (02/22/2023)   [employer] of Occupational Health - Occupational Stress Questionnaire    Feeling of Stress : To some extent  Social Connections: Socially Integrated (02/22/2023)   Social Connection and Isolation Panel [NHANES]    Frequency of Communication with Friends and Family: Three times a week    Frequency of Social Gatherings with Friends and Family: Once a week    Attends Religious Services: More than 4 times per year    Active Member of Clubs or Organizations: Yes    Attends Engineer, structural: More than 4 times per year    Marital Status: Married  Catering manager Violence: Not on file    Family History  Problem Relation Age of Onset   Cirrhosis Mother    Hyperlipidemia Father    Hypertension Father    Colon polyps Father    Diabetes Mellitus I Father 29   Diabetes Maternal Aunt    Colon cancer Paternal Uncle 68   Colon polyps Brother    Colon cancer Paternal Aunt 27   Esophageal cancer Neg Hx    Rectal cancer Neg Hx    Stomach cancer Neg Hx     Allergies  Allergen Reactions   Lactose Other (See Comments)   Latex Rash      PE Today's Vitals   06/28/23 0759  BP: 122/78  Pulse: 80  Temp: 97.6 F (36.4 C)  TempSrc: Oral  SpO2: 98%  Weight: 181 lb (82.1 kg)  Height: 5' 6.5" (1.689 m)   Body mass index is 28.78 kg/m.  Physical Exam Vitals reviewed. Exam conducted  with a chaperone present.  Constitutional:      General: She is not in acute distress.    Appearance: Normal appearance.  HENT:     Head: Normocephalic and atraumatic.     Nose: Nose normal.  Eyes:     Extraocular Movements: Extraocular movements intact.  Conjunctiva/sclera: Conjunctivae normal.  Neck:     Thyroid: No thyroid mass, thyromegaly or thyroid tenderness.  Pulmonary:     Effort: Pulmonary effort is normal.  Chest:     Chest wall: No mass or tenderness.  Breasts:    Right: Normal. No swelling, mass, nipple discharge or tenderness.     Left: Normal. No swelling, mass, nipple discharge or tenderness.     Comments: Bilateral mammoplasty scars Abdominal:     General: There is no distension.     Palpations: Abdomen is soft.     Tenderness: There is no abdominal tenderness.  Genitourinary:    General: Normal vulva.     Exam position: Lithotomy position.     Urethra: No prolapse.     Vagina: Normal. No vaginal discharge or bleeding.     Cervix: No lesion.     Adnexa: Right adnexa normal and left adnexa normal.     Comments: Cervix and uterus absent Musculoskeletal:        General: Normal range of motion.     Cervical back: Normal range of motion.  Lymphadenopathy:     Upper Body:     Right upper body: No axillary adenopathy.     Left upper body: No axillary adenopathy.     Lower Body: No right inguinal adenopathy. No left inguinal adenopathy.  Skin:    General: Skin is warm and dry.  Neurological:     General: No focal deficit present.     Mental Status: She is alert.  Psychiatric:        Mood and Affect: Mood normal.        Behavior: Behavior normal.       Assessment and Plan:        Well woman exam with routine gynecological exam Assessment & Plan: Cervical cancer screening performed according to ASCCP guidelines. Encouraged annual mammogram screening Colonoscopy UTD DXA N/A Labs and immunizations with her primary Encouraged safe sexual practices as  indicated Encouraged healthy lifestyle practices with diet and exercise For patients under 50-70yo, I recommend 1200mg  calcium daily and 600IU of vitamin D daily.    S/P total hysterectomy  Postmenopausal hormone replacement therapy -     Estradiol; Place 1 patch (0.05 mg total) onto the skin 2 (two) times a week.  Dispense: 24 patch; Refill: 3  Negative depression screening   Romaine Closs, MD

## 2023-07-05 ENCOUNTER — Ambulatory Visit
Admission: RE | Admit: 2023-07-05 | Discharge: 2023-07-05 | Disposition: A | Discharge status: Home / Self Care | Source: Ambulatory Visit

## 2023-07-05 DIAGNOSIS — Z1231 Encounter for screening mammogram for malignant neoplasm of breast: Secondary | ICD-10-CM

## 2023-07-08 ENCOUNTER — Encounter: Payer: Self-pay | Admitting: Obstetrics and Gynecology

## 2023-07-09 ENCOUNTER — Other Ambulatory Visit: Payer: Self-pay | Admitting: Obstetrics and Gynecology

## 2023-07-09 ENCOUNTER — Ambulatory Visit
Admission: RE | Admit: 2023-07-09 | Discharge: 2023-07-09 | Disposition: A | Discharge status: Home / Self Care | Source: Ambulatory Visit | Attending: Obstetrics and Gynecology | Admitting: Obstetrics and Gynecology

## 2023-07-09 ENCOUNTER — Ambulatory Visit

## 2023-07-09 DIAGNOSIS — R928 Other abnormal and inconclusive findings on diagnostic imaging of breast: Secondary | ICD-10-CM

## 2023-07-12 ENCOUNTER — Encounter: Payer: Self-pay | Admitting: Obstetrics and Gynecology

## 2023-07-22 ENCOUNTER — Encounter

## 2023-07-22 DIAGNOSIS — Z1231 Encounter for screening mammogram for malignant neoplasm of breast: Secondary | ICD-10-CM

## 2023-09-13 NOTE — Telephone Encounter (Signed)
 Addressed.

## 2023-10-08 ENCOUNTER — Other Ambulatory Visit: Payer: Self-pay | Admitting: Internal Medicine

## 2023-10-26 ENCOUNTER — Encounter: Payer: Self-pay | Admitting: Internal Medicine

## 2023-10-26 NOTE — Progress Notes (Signed)
 Subjective:    Patient ID: Courtney Tran, female    DOB: May 16, 1965, 58 y.o.   MRN: [id number]     HPI Courtney Tran is here for follow up of her chronic medical problems.  She is exercising regularly - walks, water aeorbics  Had meniscus repair right knee in march - did well.   Medications and allergies reviewed with patient and updated if appropriate.  Current Outpatient Medications on File Prior to Visit  Medication Sig Dispense Refill   cholecalciferol (VITAMIN D ) 1000 UNITS tablet Take 1,000 Units by mouth daily.     cyanocobalamin 100 MCG tablet Take 100 mcg by mouth daily.     desoximetasone (TOPICORT) 0.25 % cream SMARTSIG:1 Topical Daily     desvenlafaxine  (PRISTIQ ) 50 MG 24 hr tablet TAKE 1 TABLET BY MOUTH DAILY 90 tablet 3   esomeprazole  (NEXIUM ) 40 MG capsule Take 1 capsule (40 mg total) by mouth daily before breakfast. 30 capsule 5   estradiol  (DOTTI ) 0.05 MG/24HR patch Place 1 patch (0.05 mg total) onto the skin 2 (two) times a week. 24 patch 3   Fexofenadine HCl (ALLEGRA ALLERGY PO) Take by mouth.     Prenatal Vit-Fe Fumarate-FA (PRENATAL MULTIVITAMIN) TABS tablet Take 1 tablet by mouth daily at 12 noon.     rosuvastatin  (CRESTOR ) 5 MG tablet TAKE 1 TABLET BY MOUTH DAILY 90 tablet 1   Thiamine HCl (VITAMIN B-1) 250 MG tablet Take 250 mg by mouth daily.     tirzepatide  (MOUNJARO ) 15 MG/0.5ML Pen INJECT THE CONTENTS OF ONE PEN  SUBCUTANEOUSLY WEEKLY AS  DIRECTED 6 mL 0   vitamin E 100 UNIT capsule Take 100 Units by mouth.     No current facility-administered medications on file prior to visit.     Review of Systems  Constitutional:  Negative for fever.  Respiratory:  Negative for cough, shortness of breath and wheezing.   Cardiovascular:  Negative for chest pain, palpitations and leg swelling.  Neurological:  Negative for light-headedness and headaches.       Objective:   Vitals:   10/27/23 0807  BP: 134/76  Pulse: 75  Temp: 98.3 F (36.8 C)   SpO2: 100%   BP Readings from Last 3 Encounters:  10/27/23 134/76  06/28/23 122/78  03/02/23 124/82   Wt Readings from Last 3 Encounters:  10/27/23 185 lb (83.9 kg)  06/28/23 181 lb (82.1 kg)  03/02/23 189 lb (85.7 kg)   Body mass index is 29.41 kg/m.    Physical Exam Constitutional:      General: She is not in acute distress.    Appearance: Normal appearance.  HENT:     Head: Normocephalic and atraumatic.  Eyes:     Conjunctiva/sclera: Conjunctivae normal.  Cardiovascular:     Rate and Rhythm: Normal rate and regular rhythm.     Heart sounds: Normal heart sounds.  Pulmonary:     Effort: Pulmonary effort is normal. No respiratory distress.     Breath sounds: Normal breath sounds. No wheezing.  Musculoskeletal:     Cervical back: Neck supple.     Right lower leg: No edema.     Left lower leg: No edema.  Lymphadenopathy:     Cervical: No cervical adenopathy.  Skin:    General: Skin is warm and dry.     Findings: No rash.  Neurological:     Mental Status: She is alert. Mental status is at baseline.  Psychiatric:  Mood and Affect: Mood normal.        Behavior: Behavior normal.       Diabetic Foot Exam - Simple   Simple Foot Form Diabetic Foot exam was performed with the following findings: Yes 10/27/2023  8:42 AM  Visual Inspection No deformities, no ulcerations, no other skin breakdown bilaterally: Yes Sensation Testing Intact to touch and monofilament testing bilaterally: Yes Pulse Check Posterior Tibialis and Dorsalis pulse intact bilaterally: Yes Comments      Lab Results  Component Value Date   WBC 6.5 02/24/2023   HGB 15.9 (H) 02/24/2023   HCT 46.0 02/24/2023   PLT 253.0 02/24/2023   GLUCOSE 92 02/24/2023   CHOL 143 02/24/2023   TRIG 86.0 02/24/2023   HDL 48.00 02/24/2023   LDLDIRECT 199.0 11/27/2021   LDLCALC 78 02/24/2023   ALT 29 02/24/2023   AST 20 02/24/2023   NA 140 02/24/2023   K 3.7 02/24/2023   CL 106 02/24/2023    CREATININE 0.92 02/24/2023   BUN 9 02/24/2023   CO2 28 02/24/2023   TSH 2.49 02/24/2023   HGBA1C 5.5 02/24/2023     Assessment & Plan:    See Problem List for Assessment and Plan of chronic medical problems.

## 2023-10-26 NOTE — Patient Instructions (Addendum)
      Blood work was ordered.       Medications changes include :   None      Return in about 6 months (around 04/28/2024) for Physical Exam.

## 2023-10-27 ENCOUNTER — Other Ambulatory Visit: Payer: Self-pay | Admitting: Internal Medicine

## 2023-10-27 ENCOUNTER — Ambulatory Visit (INDEPENDENT_AMBULATORY_CARE_PROVIDER_SITE_OTHER): Admitting: Internal Medicine

## 2023-10-27 VITALS — BP 134/76 | HR 75 | Temp 98.3°F | Ht 66.5 in | Wt 185.0 lb

## 2023-10-27 DIAGNOSIS — E1169 Type 2 diabetes mellitus with other specified complication: Secondary | ICD-10-CM

## 2023-10-27 DIAGNOSIS — F3289 Other specified depressive episodes: Secondary | ICD-10-CM | POA: Diagnosis not present

## 2023-10-27 DIAGNOSIS — E785 Hyperlipidemia, unspecified: Secondary | ICD-10-CM | POA: Diagnosis not present

## 2023-10-27 DIAGNOSIS — K219 Gastro-esophageal reflux disease without esophagitis: Secondary | ICD-10-CM

## 2023-10-27 DIAGNOSIS — F419 Anxiety disorder, unspecified: Secondary | ICD-10-CM | POA: Diagnosis not present

## 2023-10-27 DIAGNOSIS — Z7984 Long term (current) use of oral hypoglycemic drugs: Secondary | ICD-10-CM

## 2023-10-27 NOTE — Assessment & Plan Note (Signed)
 Chronic GERD controlled Continue Nexium  40 mg every other day - trying to decrease

## 2023-10-27 NOTE — Assessment & Plan Note (Signed)
 Chronic Controlled, Stable Continue Pristiq  50 mg daily

## 2023-10-27 NOTE — Assessment & Plan Note (Signed)
 Chronic Regular exercise and healthy diet encouraged Check lipid panel, cmp Continue Crestor 5 mg daily

## 2023-10-27 NOTE — Assessment & Plan Note (Addendum)
 Chronic   Lab Results  Component Value Date   HGBA1C 5.5 02/24/2023   Sugars controlled Check A1c, urine microalbumin Continue Mounjaro  15 mg weekly  regular exercise, diabetic diet Stressed protein intake

## 2023-12-26 ENCOUNTER — Other Ambulatory Visit: Payer: Self-pay | Admitting: Internal Medicine

## 2024-05-03 ENCOUNTER — Encounter: Admitting: Internal Medicine

## 2024-06-29 ENCOUNTER — Ambulatory Visit: Admitting: Obstetrics and Gynecology
